# Patient Record
Sex: Female | Born: 1985 | Hispanic: Yes | Marital: Single | Smoking: Never smoker
Health system: Southern US, Community
[De-identification: ages and names within clinical notes are randomized; demographics above are authoritative.]

## PROBLEM LIST (undated history)

## (undated) DIAGNOSIS — Z789 Other specified health status: Secondary | ICD-10-CM

## (undated) DIAGNOSIS — I10 Essential (primary) hypertension: Secondary | ICD-10-CM

## (undated) HISTORY — PX: NO PAST SURGERIES: SHX2092

## (undated) HISTORY — DX: Essential (primary) hypertension: I10

## (undated) HISTORY — DX: Other specified health status: Z78.9

---

## 2005-08-04 ENCOUNTER — Emergency Department (HOSPITAL_COMMUNITY): Admission: EM | Admit: 2005-08-04 | Discharge: 2005-08-04 | Payer: Self-pay | Admitting: Emergency Medicine

## 2005-08-07 ENCOUNTER — Ambulatory Visit (HOSPITAL_COMMUNITY): Admission: RE | Admit: 2005-08-07 | Discharge: 2005-08-07 | Payer: Self-pay | Admitting: Emergency Medicine

## 2007-06-08 ENCOUNTER — Emergency Department (HOSPITAL_COMMUNITY): Admission: EM | Admit: 2007-06-08 | Discharge: 2007-06-08 | Payer: Self-pay | Admitting: Emergency Medicine

## 2007-06-18 ENCOUNTER — Emergency Department (HOSPITAL_COMMUNITY): Admission: EM | Admit: 2007-06-18 | Discharge: 2007-06-18 | Payer: Self-pay | Admitting: Emergency Medicine

## 2007-10-10 ENCOUNTER — Emergency Department (HOSPITAL_COMMUNITY): Admission: EM | Admit: 2007-10-10 | Discharge: 2007-10-10 | Payer: Self-pay | Admitting: Emergency Medicine

## 2009-04-05 ENCOUNTER — Emergency Department (HOSPITAL_COMMUNITY): Admission: EM | Admit: 2009-04-05 | Discharge: 2009-04-05 | Payer: Self-pay | Admitting: Family Medicine

## 2009-09-13 ENCOUNTER — Ambulatory Visit: Payer: Self-pay | Admitting: Internal Medicine

## 2009-09-13 ENCOUNTER — Encounter (INDEPENDENT_AMBULATORY_CARE_PROVIDER_SITE_OTHER): Payer: Self-pay | Admitting: Family Medicine

## 2009-09-13 LAB — CONVERTED CEMR LAB
AST: 15 units/L (ref 0–37)
Albumin: 4.2 g/dL (ref 3.5–5.2)
Alkaline Phosphatase: 95 units/L (ref 39–117)
BUN: 8 mg/dL (ref 6–23)
CO2: 22 meq/L (ref 19–32)
Chloride: 102 meq/L (ref 96–112)
Eosinophils Absolute: 0.2 10*3/uL (ref 0.0–0.7)
Eosinophils Relative: 4 % (ref 0–5)
Glucose, Bld: 105 mg/dL — ABNORMAL HIGH (ref 70–99)
Hemoglobin: 13.9 g/dL (ref 12.0–15.0)
Lymphocytes Relative: 40 % (ref 12–46)
Lymphs Abs: 2.5 10*3/uL (ref 0.7–4.0)
MCHC: 32.8 g/dL (ref 30.0–36.0)
MCV: 93.6 fL (ref 78.0–100.0)
Monocytes Absolute: 0.3 10*3/uL (ref 0.1–1.0)
Monocytes Relative: 5 % (ref 3–12)
Potassium: 4.1 meq/L (ref 3.5–5.3)
RDW: 12.3 % (ref 11.5–15.5)
Sed Rate: 30 mm/hr — ABNORMAL HIGH (ref 0–22)
Total Bilirubin: 0.3 mg/dL (ref 0.3–1.2)

## 2009-09-14 ENCOUNTER — Ambulatory Visit (HOSPITAL_COMMUNITY): Admission: RE | Admit: 2009-09-14 | Discharge: 2009-09-14 | Payer: Self-pay | Admitting: Family Medicine

## 2009-09-19 ENCOUNTER — Ambulatory Visit: Payer: Self-pay | Admitting: Internal Medicine

## 2009-09-19 ENCOUNTER — Encounter (INDEPENDENT_AMBULATORY_CARE_PROVIDER_SITE_OTHER): Payer: Self-pay | Admitting: Family Medicine

## 2009-11-23 ENCOUNTER — Ambulatory Visit: Payer: Self-pay | Admitting: Internal Medicine

## 2011-02-21 LAB — URINALYSIS, ROUTINE W REFLEX MICROSCOPIC
Bilirubin Urine: NEGATIVE
Glucose, UA: NEGATIVE
Glucose, UA: NEGATIVE
Hgb urine dipstick: NEGATIVE
Ketones, ur: 15 — AB
Ketones, ur: >80 — AB
Protein, ur: NEGATIVE
Specific Gravity, Urine: 1.028
Specific Gravity, Urine: 1.029
Urobilinogen, UA: 1
Urobilinogen, UA: 4 — ABNORMAL HIGH
pH: 6
pH: 6.5

## 2011-02-21 LAB — COMPREHENSIVE METABOLIC PANEL
ALT: 15
Alkaline Phosphatase: 73
BUN: 8
CO2: 23
Chloride: 103
Creatinine, Ser: 0.65
GFR calc Af Amer: >60
Potassium: 3.4 — ABNORMAL LOW
Total Bilirubin: 0.6
Total Protein: 7.5

## 2011-02-21 LAB — DIFFERENTIAL
Basophils Absolute: 0
Basophils Relative: 0
Eosinophils Relative: 0
Lymphs Abs: 1.4
Monocytes Absolute: 0.3
Monocytes Relative: 4
Neutro Abs: 6.9
Neutrophils Relative %: 79 — ABNORMAL HIGH

## 2011-02-21 LAB — PREGNANCY, URINE: Preg Test, Ur: NEGATIVE

## 2011-02-21 LAB — URINE CULTURE: Colony Count: >=100000

## 2011-02-21 LAB — CBC
HCT: 39.5
MCHC: 34.3
RDW: 12.5

## 2011-07-30 ENCOUNTER — Encounter (HOSPITAL_COMMUNITY): Payer: Self-pay | Admitting: Emergency Medicine

## 2011-07-30 ENCOUNTER — Emergency Department (INDEPENDENT_AMBULATORY_CARE_PROVIDER_SITE_OTHER): Payer: Self-pay

## 2011-07-30 ENCOUNTER — Emergency Department (HOSPITAL_COMMUNITY)
Admission: EM | Admit: 2011-07-30 | Discharge: 2011-07-30 | Disposition: A | Discharge status: Home / Self Care | Payer: Self-pay | Source: Home / Self Care | Attending: Family Medicine | Admitting: Family Medicine

## 2011-07-30 ENCOUNTER — Emergency Department (HOSPITAL_COMMUNITY): Payer: Self-pay

## 2011-07-30 DIAGNOSIS — M25559 Pain in unspecified hip: Secondary | ICD-10-CM

## 2011-07-30 LAB — POCT URINALYSIS DIP (DEVICE)
Bilirubin Urine: NEGATIVE
Leukocytes, UA: NEGATIVE
Nitrite: NEGATIVE
Protein, ur: NEGATIVE mg/dL
Urobilinogen, UA: 0.2 mg/dL (ref 0.0–1.0)
pH: 6 (ref 5.0–8.0)

## 2011-07-30 LAB — POCT PREGNANCY, URINE: Preg Test, Ur: NEGATIVE

## 2011-07-30 MED ORDER — NAPROXEN 375 MG PO TABS: 375.0000 mg | ORAL_TABLET | Freq: Two times a day (BID) | ORAL | Status: AC

## 2011-07-30 MED ORDER — HYDROCODONE-ACETAMINOPHEN 5-325 MG PO TABS: ORAL_TABLET | ORAL | Status: AC

## 2011-07-30 NOTE — Discharge Instructions (Signed)
Your x-ray was negative for any acute findings. Based on my examination, I feel that the pain is likely related to the hip and not the stomach. So, I recommend a followup appointment and evaluation with an orthopedic doctor, which is a special type of Dr. that deals only with muscles and bones. Please call the office listed and make the next available appointment. You might also need a referral from your regular doctor. Take medication as directed. Take the naproxen regularly, every day for baseline pain control. Use the hydrocodone, a pain pill only, as needed. Return to care, sooner should your symptoms not improve, or worsen in any way.

## 2011-07-30 NOTE — ED Provider Notes (Signed)
History     CSN: [id number]  Arrival date & time 07/30/11  1109   First MD Initiated Contact with Patient 07/30/11 1226      Chief Complaint  Patient presents with  . Abdominal Pain    (Consider location/radiation/quality/duration/timing/severity/associated sxs/prior treatment) HPI Comments: Caroljean presents for evaluation of right lower quadrant pain and right hip pain. She reports pain over the last one month. She denies any injury. She reports that the pain comes and goes. She denies any nausea, vomiting, or change in bowel habits. Her last period was on February 5. She denies any urinary symptoms. No dysuria. No hematuria. She denies any vaginal discharge or pain. She reports pain in her hip, with any movement and with walking. She denies any numbness, tingling, or weakness. Patient is a 26 y.o. female presenting with abdominal pain.  Abdominal Pain The primary symptoms of the illness include abdominal pain. The primary symptoms of the illness do not include nausea, vomiting, diarrhea, dysuria, vaginal discharge or vaginal bleeding. The onset of the illness was sudden. The problem has not changed since onset. The abdominal pain began more than 2 days ago. The pain came on suddenly. The abdominal pain has been unchanged since its onset. The abdominal pain is located in the RLQ. The abdominal pain radiates to the right leg.    History reviewed. No pertinent past medical history.  History reviewed. No pertinent past surgical history.  History reviewed. No pertinent family history.  History  Substance Use Topics  . Smoking status: Never Smoker   . Smokeless tobacco: Not on file  . Alcohol Use: No    OB History    Grav Para Term Preterm Abortions TAB SAB Ect Mult Living                  Review of Systems  Constitutional: Negative.   HENT: Negative.   Eyes: Negative.   Respiratory: Negative.   Cardiovascular: Negative.   Gastrointestinal: Positive for abdominal pain.  Negative for nausea, vomiting and diarrhea.  Genitourinary: Negative.  Negative for dysuria, vaginal bleeding and vaginal discharge.  Musculoskeletal: Positive for arthralgias.  Skin: Negative.   Neurological: Negative.     Allergies  Review of patient's allergies indicates no known allergies.  Home Medications   Current Outpatient Rx  Name Route Sig Dispense Refill  . OVER THE COUNTER MEDICATION  Birth control pill    . HYDROCODONE-ACETAMINOPHEN 5-325 MG PO TABS  Take one to two tablets every 4 to 6 hours as needed for pain 20 tablet 0  . NAPROXEN 375 MG PO TABS Oral Take 1 tablet (375 mg total) by mouth 2 (two) times daily. 20 tablet 0    BP 127/76  Pulse 80  Temp(Src) 98.4 F (36.9 C) (Oral)  Resp 16  SpO2 100%  LMP 07/09/2011  Physical Exam  Nursing note and vitals reviewed. Constitutional: She is oriented to person, place, and time. She appears well-developed and well-nourished.  HENT:  Head: Normocephalic and atraumatic.  Eyes: EOM are normal.  Neck: Normal range of motion.  Cardiovascular: Normal rate and regular rhythm.   Pulmonary/Chest: Effort normal and breath sounds normal. She has no decreased breath sounds. She has no wheezes. She has no rhonchi.  Musculoskeletal:       Right hip: She exhibits tenderness and bony tenderness. She exhibits normal range of motion and normal strength.       RIGHT hip: full flexion, extension, abduction, adduction against resistance; 5/5 strength; pain elicited with  internal or external rotation of hip; pain elicited with resisted and passive flexion and extension; negative FABERE  Neurological: She is alert and oriented to person, place, and time.  Skin: Skin is warm and dry.  Psychiatric: Her behavior is normal.    ED Course  Procedures (including critical care time)  Labs Reviewed  POCT URINALYSIS DIP (DEVICE) - Abnormal; Notable for the following:    Ketones, ur TRACE (*)    Hgb urine dipstick TRACE (*)    All other  components within normal limits  POCT PREGNANCY, URINE   Dg Hip Complete Right  07/30/2011  *RADIOLOGY REPORT*  Clinical Data: Right hip pain.  RIGHT HIP - COMPLETE 2+ VIEW  Comparison:  None.  Findings:  There is no evidence of hip fracture or dislocation. There is no evidence of arthropathy or other focal bone abnormality.  IMPRESSION: Negative.  Original Report Authenticated By: Danae Orleans, M.D.     1. Hip pain       MDM  Xray reviewed by radiologist and myself; negative for any acute findings; referred to [facility]; given rx for naproxen, and hydrocodone PRN        Richardo Priest, MD 07/30/11 1707

## 2011-07-30 NOTE — ED Notes (Signed)
Checked with pt ,gave pt blanket &family member soda

## 2011-07-30 NOTE — ED Notes (Signed)
Reports abdominal pain for one month, right, lower quadrant abdominal pain .  Last bm was this am, normal.  Having a bm makes pain go away sometimes.  Last period was 2/5, not normal, shorter and light than usual, taking birth control pill for five years.  denis urinary symptoms. Denies vaginal discharge

## 2011-08-05 ENCOUNTER — Other Ambulatory Visit: Payer: Self-pay | Admitting: Family Medicine

## 2011-08-05 DIAGNOSIS — N644 Mastodynia: Secondary | ICD-10-CM

## 2011-08-08 ENCOUNTER — Ambulatory Visit
Admission: RE | Admit: 2011-08-08 | Discharge: 2011-08-08 | Disposition: A | Discharge status: Home / Self Care | Payer: Self-pay | Source: Ambulatory Visit | Attending: Family Medicine | Admitting: Family Medicine

## 2011-08-08 DIAGNOSIS — N644 Mastodynia: Secondary | ICD-10-CM

## 2012-10-22 ENCOUNTER — Encounter: Payer: Self-pay | Admitting: Obstetrics & Gynecology

## 2012-11-23 ENCOUNTER — Encounter: Payer: Self-pay | Admitting: Obstetrics & Gynecology

## 2013-02-22 ENCOUNTER — Ambulatory Visit (INDEPENDENT_AMBULATORY_CARE_PROVIDER_SITE_OTHER): Payer: Self-pay | Admitting: Obstetrics & Gynecology

## 2013-02-22 ENCOUNTER — Encounter: Payer: Self-pay | Admitting: Obstetrics & Gynecology

## 2013-02-22 VITALS — BP 112/78 | HR 76 | Temp 97.7°F | Ht <= 58 in | Wt 113.0 lb

## 2013-02-22 DIAGNOSIS — R102 Pelvic and perineal pain unspecified side: Secondary | ICD-10-CM | POA: Insufficient documentation

## 2013-02-22 DIAGNOSIS — N921 Excessive and frequent menstruation with irregular cycle: Secondary | ICD-10-CM | POA: Insufficient documentation

## 2013-02-22 DIAGNOSIS — N949 Unspecified condition associated with female genital organs and menstrual cycle: Secondary | ICD-10-CM

## 2013-02-22 MED ORDER — DICLOFENAC SODIUM 75 MG PO TBEC: 75.0000 mg | DELAYED_RELEASE_TABLET | Freq: Two times a day (BID) | ORAL | Status: DC

## 2013-02-22 NOTE — Progress Notes (Signed)
Patient ID: Diamond Baldwin, female   DOB: 03/16/1987, 27 y.o.   MRN: [id number]  Chief Complaint  Patient presents with  . Referral    from [facility]  . Pelvic Pain    RLQ pain for last 4 months     HPI Diamond Baldwin is a 27 y.o. female.  G1P1001 Patient's last menstrual period was 02/16/2013. Referred by [facility] in 5/14 with 7 months of RLQ pain which is now constant, relieved by tylenol, some dyspareunia. Has BTB on OCP  HPI  History reviewed. No pertinent past medical history.  History reviewed. No pertinent past surgical history.  History reviewed. No pertinent family history.  Social History History  Substance Use Topics  . Smoking status: Never Smoker   . Smokeless tobacco: Never Used  . Alcohol Use: No    No Known Allergies  Current Outpatient Prescriptions  Medication Sig Dispense Refill  . calcium gluconate 500 MG tablet Take 500 mg by mouth daily.      . norethindrone-ethinyl estradiol-iron (ESTROSTEP FE,TILIA FE,TRI-LEGEST FE) 1-20/1-30/1-35 MG-MCG tablet Take 1 tablet by mouth daily.       No current facility-administered medications for this visit.    Review of Systems Review of Systems  Constitutional: Negative for fever.  Gastrointestinal: Positive for constipation. Negative for nausea, diarrhea, abdominal distention and rectal pain.  Genitourinary: Positive for vaginal bleeding, menstrual problem, pelvic pain and dyspareunia. Negative for urgency and vaginal discharge.  Musculoskeletal: Positive for back pain (radiates to right back).    Blood pressure 112/78, pulse 76, temperature 97.7 F (36.5 C), temperature source Oral, height 4\' 9"  (1.448 m), weight 113 lb (51.256 kg), last menstrual period 02/16/2013.  Physical Exam Physical Exam  Constitutional: She appears well-developed and well-nourished. No distress.  Pulmonary/Chest: Effort normal. No respiratory distress.  Abdominal: Soft. She exhibits no mass. There is tenderness (mild  periumbilical). There is no rebound and no guarding.  Genitourinary: Vagina normal and uterus normal. No vaginal discharge found.  Right side and CM tenderness  Neurological: She is alert.  Skin: Skin is warm and dry.  Psychiatric: She has a normal mood and affect. Her behavior is normal.    Data Reviewed referral  Assessment    Pelvic pain and BTB on OCP     Plan    Pelvic US Voltaren 75 mg BID 10 days then prn RTC next mo          ARNOLD,JAMES 02/22/2013, 4:08 PM

## 2013-02-22 NOTE — Patient Instructions (Signed)
Uso de los anticonceptivos orales  (Oral Contraception Use) Los anticonceptivos orales son medicamentos que se utilizan para evitar el embarazo. Su funcin es evitar que los ovarios liberen vulos. Las hormonas de los anticonceptivos orales hacen que el moco cervical se haga ms espeso, lo que evita que el esperma ingrese al tero. Tambin hacen que la membrana que tapiza el tero se vuelva ms fina, lo que no permite que el huevo fertilizado se adhiera a la pared del tero. Los anticonceptivos orales son muy efectivos cuando se toman exactamente como se prescriben. Sin embargo, los anticonceptivos orales no previenen contra las enfermedades de transmisin sexual (ETS). La prctica del sexo seguro, como el uso de preservativos, junto con los anticonceptivos orales, ayudan a prevenir ese tipo de enfermedades. Antes de tomar la pldora, usted debe hacerse un examen fsico y un [place]. El mdico podr indicarle anlisis de sangre, si es necesario. El mdico se asegurar de que usted es una buena candidata para usar anticonceptivos orales. Converse con su mdico acerca de los posibles efectos secundarios de los anticonceptivos orales. Cuando se inicia el uso de anticonceptivos orales, se pueden tomar durante 2 a 3 meses para que el cuerpo se adapte a los cambios en los niveles hormonales en el cuerpo.  CMO TOMAR LOS ANTICONCEPTIVOS ORALES  El mdico le indicar como comenzar a tomar el primer ciclo de anticonceptivos orales. De lo contrario usted puede:   Comenzar el 1er. da del ciclo menstrual. No necesitar proteccin anticonceptiva adicional al comenzar en este momento.  Comenzar el primer domingo luego de su perodo menstrual, o el da en que adquiere el medicamento. En estos casos deber tener proteccin anticonceptiva adicional durante los primeros 7 das del ciclo. Luego de comenzar a tomar los anticonceptivos orales:   Si olvid de tomar 1 pldora, tmela tan pronto como lo recuerde. Tome la  siguiente pldora a la hora habitual.  Si olvida tomar 2  ms pldoras, utilice un mtodo anticonceptivo adicional hasta que comience su prximo perodo menstrual.  Si utiliza el envase de 28 pldoras y olvida tomar 1 de las ltimas 7 (pldoras sin hormonas), sto no tiene importancia. Simplemente deseche el resto de las pldoras que no contienen hormonas y comience un nuevo envase. No importa cuando comience a tomar los anticonceptivos, siempre empiece un nuevo envase el mismo da de la semana. Tenga un envase extra de pldoras anticonceptivas y use un mtodo anticonceptivo adicional para el caso en que se olvide de tomar algunas pldoras o pierda la caja.  INSTRUCCIONES PARA EL CUIDADO DOMICILIARIO  No fume.  Siempre use un condn para protegerse de las enfermedades de transmisin sexual. Los anticonceptivos orales no protegen contra las enfermedades de transmisin sexual.  Marque en un calendario las fechas en las que tiene sus perodos menstruales.  Lea la informacin y consejos que vienen con las pldoras. Pngase en contacto con el mdico siempre que tenga preguntas. SOLICITE ATENCIN MDICA SI:  Presenta nuseas o vmitos.  Tiene flujo o sangrado vaginal anormal.  Aparece una erupcin cutnea.  No tiene el perodo menstrual.  Pierde el cabello.  Necesita tratamiento por cambios en su estado de nimo o por depresin.  Se siente mareada al tomar la pldora.  Comienza a aparecer acn con el uso de los anticonceptivos orales.  Queda embarazada. SOLICITE ATENCIN MDICA DE INMEDIATO SI:  Siente dolor en el pecho.  Le falta el aire.  Le duele mucho la cabeza y no puede controlar el dolor.  Siente adormecimiento o tiene   dificultad para hablar.  Tiene problemas de visin.  Presenta dolor, inflamacin o hinchazn en las piernas. Document Released: 05/09/2011 Document Revised: 08/12/2011 [employer] Patient Information 2014 [employer].  

## 2013-03-05 ENCOUNTER — Encounter (HOSPITAL_COMMUNITY): Payer: Self-pay | Admitting: Emergency Medicine

## 2013-03-09 ENCOUNTER — Ambulatory Visit (HOSPITAL_COMMUNITY): Payer: Self-pay

## 2013-03-25 ENCOUNTER — Ambulatory Visit: Payer: Self-pay | Admitting: Obstetrics & Gynecology

## 2013-03-30 ENCOUNTER — Ambulatory Visit (HOSPITAL_COMMUNITY)
Admission: RE | Admit: 2013-03-30 | Discharge: 2013-03-30 | Disposition: A | Discharge status: Home / Self Care | Payer: Self-pay | Source: Ambulatory Visit | Attending: Obstetrics & Gynecology | Admitting: Obstetrics & Gynecology

## 2013-03-30 DIAGNOSIS — N921 Excessive and frequent menstruation with irregular cycle: Secondary | ICD-10-CM

## 2013-03-30 DIAGNOSIS — N949 Unspecified condition associated with female genital organs and menstrual cycle: Secondary | ICD-10-CM | POA: Insufficient documentation

## 2013-03-30 DIAGNOSIS — R102 Pelvic and perineal pain: Secondary | ICD-10-CM

## 2013-04-23 ENCOUNTER — Ambulatory Visit (INDEPENDENT_AMBULATORY_CARE_PROVIDER_SITE_OTHER): Payer: Self-pay | Admitting: Obstetrics & Gynecology

## 2013-04-23 ENCOUNTER — Encounter: Payer: Self-pay | Admitting: Obstetrics & Gynecology

## 2013-04-23 VITALS — BP 120/79 | HR 81 | Temp 97.0°F | Ht <= 58 in | Wt 115.6 lb

## 2013-04-23 DIAGNOSIS — R102 Pelvic and perineal pain: Secondary | ICD-10-CM

## 2013-04-23 DIAGNOSIS — N949 Unspecified condition associated with female genital organs and menstrual cycle: Secondary | ICD-10-CM

## 2013-04-23 MED ORDER — DICLOFENAC SODIUM 75 MG PO TBEC: 75.0000 mg | DELAYED_RELEASE_TABLET | Freq: Two times a day (BID) | ORAL | Status: DC

## 2013-04-23 NOTE — Patient Instructions (Signed)
Back Pain, Adult Low back pain is very common. About 1 in 5 people have back pain.The cause of low back pain is rarely dangerous. The pain often gets better over time.About half of people with a sudden onset of back pain feel better in just 2 weeks. About 8 in 10 people feel better by 6 weeks.  CAUSES Some common causes of back pain include:  Strain of the muscles or ligaments supporting the spine.  Wear and tear (degeneration) of the spinal discs.  Arthritis.  Direct injury to the back. DIAGNOSIS Most of the time, the direct cause of low back pain is not known.However, back pain can be treated effectively even when the exact cause of the pain is unknown.Answering your caregiver's questions about your overall health and symptoms is one of the most accurate ways to make sure the cause of your pain is not dangerous. If your caregiver needs more information, he or she may order lab work or imaging tests (X-rays or MRIs).However, even if imaging tests show changes in your back, this usually does not require surgery. HOME CARE INSTRUCTIONS For many people, back pain returns.Since low back pain is rarely dangerous, it is often a condition that people can learn to manageon their own.   Remain active. It is stressful on the back to sit or stand in one place. Do not sit, drive, or stand in one place for more than 30 minutes at a time. Take short walks on level surfaces as soon as pain allows.Try to increase the length of time you walk each day.  Do not stay in bed.Resting more than 1 or 2 days can delay your recovery.  Do not avoid exercise or work.Your body is made to move.It is not dangerous to be active, even though your back may hurt.Your back will likely heal faster if you return to being active before your pain is gone.  Pay attention to your body when you bend and lift. Many people have less discomfortwhen lifting if they bend their knees, keep the load close to their bodies,and  avoid twisting. Often, the most comfortable positions are those that put less stress on your recovering back.  Find a comfortable position to sleep. Use a firm mattress and lie on your side with your knees slightly bent. If you lie on your back, put a pillow under your knees.  Only take over-the-counter or prescription medicines as directed by your caregiver. Over-the-counter medicines to reduce pain and inflammation are often the most helpful.Your caregiver may prescribe muscle relaxant drugs.These medicines help dull your pain so you can more quickly return to your normal activities and healthy exercise.  Put ice on the injured area.  Put ice in a plastic bag.  Place a towel between your skin and the bag.  Leave the ice on for 15-20 minutes, 03-04 times a day for the first 2 to 3 days. After that, ice and heat may be alternated to reduce pain and spasms.  Ask your caregiver about trying back exercises and gentle massage. This may be of some benefit.  Avoid feeling anxious or stressed.Stress increases muscle tension and can worsen back pain.It is important to recognize when you are anxious or stressed and learn ways to manage it.Exercise is a great option. SEEK MEDICAL CARE IF:  You have pain that is not relieved with rest or medicine.  You have pain that does not improve in 1 week.  You have new symptoms.  You are generally not feeling well. SEEK   IMMEDIATE MEDICAL CARE IF:   You have pain that radiates from your back into your legs.  You develop new bowel or bladder control problems.  You have unusual weakness or numbness in your arms or legs.  You develop nausea or vomiting.  You develop abdominal pain.  You feel faint. Document Released: 05/20/2005 Document Revised: 11/19/2011 Document Reviewed: 10/08/2010 [facility] Patient Information 2014 [place], [place]. Dolor plvico en la mujer  (Pelvic Pain, Female)  Las causa del dolor plvico en la mujer pueden ser muchas y  pueden tener su origen en diferentes lugares. El dolor plvico es el que aparece en la mitad inferior del abdomen y [place] caderas. Puede aparecer durante en un perodo corto de tiempo (agudo)o puede ser recurrente (crnico). Esta afeccin puede estar relacionada con trastornos que afectan a los rganos reproductivos femeninos (ginecolgica), pero tambin puede deberse a problemas en la vejiga, clculos renales, complicaciones intestinales, o problemas musculares o esquelticos. Es Garment/textile technologist ayuda de inmediato, sobre todo si ha sido intenso, [place], o ha aparecido de Wellsite geologist sbita como un dolor inusual. Tambin es importante obtener ayuda de inmediato, ya que algunos tipos de dolor plvico puede poner en peligro la vida.  CAUSAS  A continuacin veremos algunas de las causas del dolor plvico. Las causas pueden clasificarse de diferentes modos.   Ginecolgica.  Enfermedad inflamatoria plvica.  Infecciones de transmisin sexual.  Quiste de ovario o torsin de un ligamento ovrico ( torsin ovrica).  La membrana que recubre internamente al tero desarrollndose fuera del tero (endometriosis).  Fibromas, quistes o tumores.  Ovulacin.  Embarazo.  Embarazo fuera del tero (embarazo ectpico).  Aborto espontneo.  Trabajo de [place].  Desprendimiento de la placenta o ruptura del tero.  Infecciones.  Infeccin uterina (endometritis).  Infeccin de la vejiga.  Diverticulitis.  Aborto relacionado con una infeccin uterina (aborto sptico).  Vejiga.  Inflamacin de la vejiga (cistitis).  Clculos renales.  Gastrointenstinal.  Estreimiento.  Diverticulitis.  Neurolgico.  Traumatismos.  Sentir dolor plvico debido a causas mentales o emocionales (psicosomtico).  Tumores en el intestino o en la pelvis. EVALUACIN  El mdico har una historia clnica detallada segn sus sntomas. Incluir los cambios recientes en su salud, una cuidadosa historia  ginecolgica de sus periodos (menstruaciones) y Neomia Dear historia de su actividad sexual. Los antecedentes familiares y la historia clnica tambin son importantes. Su mdico podr indicar un examen plvico. El examen plvico ayudar a identificar la ubicacin y la gravedad del Engineer, mining. Tambin ayudar a [employer] rganos que pueden estar involucrados. Myrtha Mantis identificar la causa del dolor plvico y tratarlo adecuadamente, el mdico puede indicar estudios. Estas pruebas pueden ser:   Test de embarazo.  Ecografa plvica.  Radiografa del abdomen.  Un anlisis de [place] o la evaluacin de la secrecin vaginal.  Anlisis de [place]. INSTRUCCIONES PARA EL CUIDADO EN EL HOGAR   Solo tome medicamentos de venta libre o recetados para Chief Technology Officer, Dentist o fiebre, segn las indicaciones del mdico.   Haga reposo segn las indicaciones del mdico.   Consuma una dieta balanceada.   Beba gran cantidad de lquido para mantener la orina de tono claro o amarillo plido.   Evite las relaciones sexuales, Counsellor.   Aplique compresas calientes o fras en la zona baja del abdomen segn cual le calme el dolor.   Evite las situaciones estresantes.   Lleve un registro del dolor plvico. Anote cundo comenz, dnde se localiza el dolor y si hay cosas que parecen estar asociadas  con el dolor, como algn alimento o su ciclo menstrual.  Concurra a las visitas de control con el mdico, segn las indicaciones.  SOLICITE ATENCIN MDICA SI:   Los medicamentos no Editor, commissioning.  Tiene flujo vaginal anormal. SOLICITE ATENCIN MDICA DE INMEDIATO SI:   Tiene un sangrado abundante por la vagina.   El dolor plvico aumenta.   Se siente mareada o sufre un desmayo.   Siente escalofros.   Siente dolor intenso al Geographical information systems officer u observa sangre en la orina.   Tiene diarrea o vmitos que no puede controlar.   Tiene fiebre o sntomas que persisten durante ms de [address]  [address].  Tiene fiebre y  los sntomas [address].   Ha sido abusada fsica o sexualmente.  ASEGRESE DE QUE:   Comprende estas instrucciones.  Controlar su enfermedad.  Solicitar ayuda de inmediato si no mejora o si empeora. Document Released: 08/16/2008 Document Revised: 11/19/2011 [facility] Patient Information 2014 [place], [place].

## 2013-05-04 NOTE — Progress Notes (Signed)
   Subjective:    Patient ID: Diamond Baldwin, female    DOB: 1985/08/24, 27 y.o.   MRN: [id number]  [id number] Patient's last menstrual period was 03/23/2013. Patient returns for f/u after [place] was done for pelvic pain and BTB on OCP  No past medical history on file. No past surgical history on file. Current Outpatient Prescriptions on File Prior to Visit  Medication Sig Dispense Refill  . calcium gluconate 500 MG tablet Take 500 mg by mouth daily.       No current facility-administered medications on file prior to visit.   No Known Allergies     Review of Systems  Genitourinary: Positive for pelvic pain (RLQ pain, back pain and radiates down leg). Negative for menstrual problem.       Objective:   Physical Exam  Constitutional: She appears well-developed. No distress.  Abdominal: There is no tenderness.  Skin: Skin is warm and dry.  Psychiatric: She has a normal mood and affect. Her behavior is normal.          Assessment & Plan:  Suspect MS pain, continue Voltaren and RTC 6 weeks  Adam Phenix, MD

## 2013-05-13 ENCOUNTER — Ambulatory Visit (INDEPENDENT_AMBULATORY_CARE_PROVIDER_SITE_OTHER): Payer: Self-pay | Admitting: *Deleted

## 2013-05-13 DIAGNOSIS — Z3201 Encounter for pregnancy test, result positive: Secondary | ICD-10-CM

## 2013-05-14 LAB — OBSTETRIC PANEL
Basophils Absolute: 0 10*3/uL (ref 0.0–0.1)
Basophils Relative: 0 % (ref 0–1)
Eosinophils Absolute: 0.3 10*3/uL (ref 0.0–0.7)
Eosinophils Relative: 3 % (ref 0–5)
MCH: 31.6 pg (ref 26.0–34.0)
MCV: 91.5 fL (ref 78.0–100.0)
Platelets: 302 10*3/uL (ref 150–400)
RDW: 12.4 % (ref 11.5–15.5)
WBC: 8.4 10*3/uL (ref 4.0–10.5)

## 2013-05-17 LAB — HEMOGLOBINOPATHY EVALUATION
Hemoglobin Other: 0 %
Hgb A2 Quant: 2.9 % (ref 2.2–3.2)
Hgb A: 97.1 % (ref 96.8–97.8)
Hgb S Quant: 0 %

## 2013-06-02 ENCOUNTER — Encounter: Payer: Self-pay | Admitting: Obstetrics and Gynecology

## 2013-06-02 ENCOUNTER — Ambulatory Visit (INDEPENDENT_AMBULATORY_CARE_PROVIDER_SITE_OTHER): Payer: Self-pay | Admitting: Obstetrics and Gynecology

## 2013-06-02 VITALS — BP 127/83 | Wt 111.7 lb

## 2013-06-02 DIAGNOSIS — Z3480 Encounter for supervision of other normal pregnancy, unspecified trimester: Secondary | ICD-10-CM | POA: Insufficient documentation

## 2013-06-02 DIAGNOSIS — Z3481 Encounter for supervision of other normal pregnancy, first trimester: Secondary | ICD-10-CM

## 2013-06-02 LAB — POCT URINALYSIS DIP (DEVICE)
Hgb urine dipstick: NEGATIVE
Nitrite: NEGATIVE
Urobilinogen, UA: 0.2 mg/dL (ref 0.0–1.0)
pH: 6 (ref 5.0–8.0)

## 2013-06-02 MED ORDER — PRENATAL PLUS 27-1 MG PO TABS: 1.0000 | ORAL_TABLET | Freq: Every day | ORAL | Status: DC

## 2013-06-02 NOTE — Progress Notes (Signed)
   Subjective:    Diamond Baldwin is a G2P1001 [redacted]w[redacted]d by sure LMP (last on OCPs 5 months ago) being seen today for her first obstetrical visit.  Her obstetrical history is significant for NSVD x1. Patient does intend to breast feed. Pregnancy history fully reviewed.  Patient reports backache, heartburn and nausea.  Filed Vitals:   06/02/13 1011  BP: 127/83  Weight: 111 lb 11.2 oz (50.667 kg)    HISTORY: OB History  Gravida Para Term Preterm AB SAB TAB Ectopic Multiple Living  2 1 1  0 0 0 0 0 0 1    # Outcome Date GA Lbr Len/2nd Weight Sex Delivery Anes PTL Lv  2 CUR           1 TRM 01/23/05 [redacted]w[redacted]d  6 lb 13 oz (3.09 kg) F SVD   Y     Past Medical History  Diagnosis Date  . Medical history non-contributory    Past Surgical History  Procedure Laterality Date  . No past surgeries     History reviewed. No pertinent family history.   Exam    Uterus:   10 wk size  Pelvic Exam:    Perineum: No Hemorrhoids, Normal Perineum   Vulva: normal   Vagina:  normal mucosa, normal discharge       Cervix: multiparous appearance and no bleeding following Pap   Adnexa: not evaluated   Bony Pelvis: average  System: Breast:  normal appearance, no masses or tenderness   Skin: normal coloration and turgor, no rashes    Neurologic: oriented, normal, grossly non-focal   Extremities: normal strength, tone, and muscle mass   HEENT PERRLA   Mouth/Teeth mucous membranes moist, pharynx normal without lesions and dental hygiene good   Neck supple and no masses   Cardiovascular: regular rate and rhythm, no murmurs or gallops   Respiratory:  appears well, vitals normal, no respiratory distress, acyanotic, normal RR, ear and throat exam is normal, neck free of mass or lymphadenopathy, chest clear, no wheezing, crepitations, rhonchi, normal symmetric air entry   Abdomen: soft, non-tender; bowel sounds normal; no masses,  no organomegaly   Urinary: urethral meatus normal       Assessment:    Pregnancy: G2P1001 Patient Active Problem List   Diagnosis Date Noted  . Supervision of normal subsequent pregnancy 06/02/2013  . Pelvic pain in female 02/22/2013        Plan:     Initial labs drawn. Prenatal vitamins. Rx sent. recommended Tums or Riopan dfor heartburn. Tylenol or heat for sacral pain Problem list reviewed and updated. Genetic Screening discussed Quad Screen: undecided.  Ultrasound discussed; fetal survey: requested.  Follow up in 4 weeks. 50% of 30 min visit spent on counseling and coordination of care.    Durell Lofaso 06/02/2013

## 2013-06-02 NOTE — Progress Notes (Signed)
Pulse: 74

## 2013-06-02 NOTE — Patient Instructions (Signed)
Embarazo  Primer trimestre  (Pregnancy - First Trimester)  Durante el acto sexual, millones de espermatozoides entran en la vagina. Slo 1 espermatozoide penetra y fertiliza al vulo mientras se encuentra en la trompa de [place]. Una semana ms tarde, el vulo fertilizado se implanta en la pared del tero. Un embrin comienza a desarrollarse para ser un beb. A las 6 a 8 semanas se forman los ojos y la cara y los latidos del corazn se pueden ver en la ecografa. Al final de las 12 semanas (primer trimestre) todos los rganos del beb estn formados. Ahora que est embarazada, querr hacer todo lo que est a su alcance para tener un beb sano. Dos de las cosas ms importantes son: tener una buena atencin prenatal y seguir las indicaciones del profesional que la asiste. La atencin prenatal incluye toda la asistencia mdica que usted recibe antes del nacimiento del beb. Se lleva a cabo para prevenir y tratar problemas durante el embarazo y el parto. EXAMENES PRENATALES   Durante las visitas prenatales se controlan el peso, la presin arterial y se solicitan anlisis de orina. Esto se hace para asegurarse de que usted est sana y el embarazo progrese normalmente.  Una mujer embarazada debe aumentar de 25 a 35 libras durante el embarazo. Sin embargo, si usted tiene sobrepeso o bajo peso, su mdico le aconsejar qu hacer.  El podr hacerle preguntas y responder todas las que usted le haga.  Durante los exmenes prenatales se solicitan anlisis de sangre, cultivos del tero, un [place] y otros anlisis necesarios. Estas pruebas se realizan para controlar su salud y la del beb. Se recomienda que se haga la prueba para el diagnstico del VIH, con su autorizacin. Este es el virus que causa el SIDA. Estas pruebas se realizan porque existen medicamentos que podran administrarle para prevenir que el beb nazca con esta infeccin, si usted estuviera infectada y no lo supiera. Los anlisis de sangre  tambin se realizan para determinar el tipo de sangre, si tuvo infecciones previas y controlar sus niveles en la sangre (hemoglobina).  Tener un recuento bajo de hemoglobina (anemia) es comn durante el embarazo. Para prevenirla, se administran hierro y vitaminas. En una etapa ms avanzada del embarazo, le indicarn exmenes de sangre para saber si tiene diabetes, junto con otros anlisis, en caso de que tuviera problemas.  Es necesario que se haga las pruebas para asegurarse de que usted y el beb estn bien. CAMBIOS DURANTE EL PRIMER TRIMESTRE  Su organismo atravesar numerosos cambios durante el embarazo. Estos pueden variar de una persona a otra. Converse con el mdico acerca los cambios que usted nota y que la preocupan. Ellos son:   El perodo menstrual se detiene.  El vulo y los espermatozoides llevan los genes que determinan cmo seremos. Sus genes y los de su pareja forman el beb. Los genes del varn determinan si ser un nio o una nia.  La circunferencia de la cintura va a ir aumentando y podr sentirse hinchada.  Puede tener malestar estomacal (nuseas) y vmitos. Si no puede controlar los vmitos, consulte a su mdico.  Sus mamas comenzarn a agrandarse y sensibilizarse.  Los pezones pueden sobresalir ms y ser ms oscuros.  Tendr necesidad de orinar ms. El dolor al orinar puede significar que usted tiene una infeccin de la vejiga.  Se cansar con facilidad.  Prdida del apetito.  Sentir un fuerte deseo de consumir ciertos alimentos.  Al principio, usted puede ganar o perder un par de kilos.    Podr tener cambios emocionales de un da a otro (entusiasmo por estar embarazada o preocupacin por el embarazo y el beb).  Tendr sueos ms vvidos y extraos. INSTRUCCIONES PARA EL CUIDADO EN EL HOGAR   Es muy importante evitar el cigarrillo, el alcohol y los frmacos no recetados durante el embarazo. Estas sustancias afectan la formacin y el desarrollo del beb.  Evite los productos qumicos durante el embarazo para asegurar la salud del beb.  Comience las consultas prenatales alrededor de la 12 semana de embarazo. Generalmente se programan cada mes al principio y se hacen ms frecuentes en los 2 ltimos meses antes del parto. Cumpla con las citas de control. Siga las indicaciones del mdico con respecto al uso de medicamentos, los anlisis y pruebas de laboratorio, los ejercicios y la dieta.  Durante el embarazo debe obtener nutrientes para usted y para su beb. Consuma alimentos balanceados. Elija alimentos como carne, pescado, leche y otros productos lcteos descremados, vegetales, frutas, panes integrales y cereales. El mdico le informar cul es el aumento de peso ideal.  Las nuseas matinales pueden aliviarse si come algunas galletitas saladas en la cama. Coma dos galletitas antes de levantarse por la maana. Tambin puede comer galletitas sin sal.  Hacer 4 o 5 comidas pequeas en lugar de 3 comidas grandes por da tambin puede aliviar las nuseas y los vmitos.  Beber lquidos entre las comidas en lugar de tomarlos durante las comidas tambin puede ayudar a calmar las nuseas y los vmitos.  Puede continuar teniendo relaciones sexuales durante todo el embarazo si no hay otros problemas. Los problemas pueden ser una prdida precoz (prematura) de lquido amnitico, sangrado vaginal, o dolor en el vientre (abdominal).  Realice actividad fsica todos los das, si no tiene restricciones. Consulte con su mdico o terapeuta fsico si no est segura de algunos de sus ejercicios. El mayor aumento de peso se producir en los ltimos 2 trimestres del embarazo. El ejercicio le ayudar a:  Controlar su peso.  Mantenerse en forma.  Prepararse para el parto.  La ayudar a perder el peso del embarazo despus de que nazca su beb.  Use un buen sostn o como los que se usan para hacer deportes para aliviar la sensibilidad de las mamas. Tambin puede serle  til si lo usa mientras duerme.  Consulte cuando puede comenzar con las clases de pre parto. Comience con las clases cuando estn disponibles.  No utilice la baera con agua caliente, baos turcos y saunas.   Colquese el cinturn de seguridad cuando conduzca. Este la proteger a usted y al beb en caso de accidente.  Evite comer carne cruda y el contacto con los utensilios y desperdicios de los gatos. Estos elementos contienen grmenes que pueden causar defectos de nacimiento en el beb.  El primer trimestre es un buen momento para visitar a su dentista y evaluar su salud dental. Es importante mantener los dientes limpios. Use un cepillo de dientes suave y cepllese con ms suavidad durante el embarazo.  Pida ayuda si tienen necesidades financieras, teraputicas o nutricionales. El profesional podr ayudarla con respecto a estas necesidades, o derivarla a otros especialistas.  No tome medicamentos o hierbas excepto aquellos que le indic el profesional.  Informe a su mdico si sufre violencia familiar mental o fsica.  Haga una lista de nmeros de telfono de emergencia de la familia, los amigos, el hospital y los departamentos de polica y bomberos.  Escriba sus preguntas. Llvelas cuando concurra a su visita prenatal.  No se   haga duchas vaginales.  No cruce las piernas.  Si usted tiene que estar parada por largos perodos de tiempo, gire los pies o de pequeos pasos en crculo.  Es posible que tenga ms secreciones vaginales que puedan requerir una toalla higinica. No use tampones o toallas higinicas perfumadas. EL CONSUMO DE MEDICAMENTOS Y FRMACOS DURANTE EL EMBARAZO   Tome las vitaminas para la etapa prenatal tal como se le indic. Las vitaminas deben contener un miligramo de cido flico. Guarde todas las vitaminas fuera del alcance de los nios. La ingestin de slo un par de vitaminas o tabletas que contengan hierro pueden ocasionar la muerte en un beb o en un nio  pequeo.  Evite el uso de todos los medicamentos, incluyendo hierbas, medicamentos de venta libre, sin receta o que no hayan sido sugeridos por su mdico. Slo tome medicamentos de venta libre o medicamentos recetados para el dolor, el malestar o fiebre como lo indique su mdico. No tome aspirina, ibuprofeno o naproxeno excepto que su mdico se lo indique.  Infrmele al profesional si consume medicamentos de hierbas.  El alcohol se relaciona con ciertos defectos congnitos. Incluye el sndrome de alcoholismo fetal. Debe evitar absolutamente el consumo de alcohol, en cualquier forma. El fumar causar baja tasa de natalidad y bebs prematuros.  Las drogas ilegales o de la calle son muy perjudiciales para el beb. Estn absolutamente prohibidas. Un beb que nace de una madre adicta, ser adicto al nacer. Ese beb tendr los mismos sntomas de abstinencia que un adulto.  Informe a su mdico acerca de los medicamentos que ha tomado y el motivo por el que los tom. SOLICITE ATENCIN MDICA SI:  Tiene preguntas o preocupaciones relacionadas con el embarazo. Es mejor que llame para formular las preguntas si no puede esperar hasta la prxima visita, que sentirse preocupada por ellas.  SOLICITE ATENCIN MDICA DE INMEDIATO SI:   La temperatura oral le sube a ms de 38,9 C (102 F) o lo que su mdico le indique.  Tiene una prdida de lquido por la vagina (canal de parto). Si sospecha una ruptura de las membranas, tmese la temperatura y llame al profesional para informarlo sobre esto.  Observa unas pequeas manchas o una hemorragia vaginal. Notifique al profesional acerca de la cantidad y de cuntos apsitos est utilizando.  Presenta un olor desagradable en la secrecin vaginal y observa un cambio en el color.  Contina con las nuseas y no obtiene alivio de los remedios indicados. Vomita sangre o algo similar a la borra del caf.  Pierde ms de 2 libras (1 Kg) en una semana.  Aumenta ms de 1 Kg  en una semana y nota el rostro, las manos, los pies o las piernas hinchados.  Aumenta ms de 2,5 Kg en una semana (aunque no tenga las manos, pies, piernas o el rostro hinchados).  Ha estado expuesta a la rubola y no ha sufrido la enfermedad.  Ha estado expuesta a la quinta enfermedad o a la varicela.  Siente dolor en el vientre (abdominal). Las molestias en el ligamento redondo son una causa benigna frecuente de dolor abdominal durante el embarazo. El profesional que la asiste deber evaluarla.  Presenta dolor de cabeza, fiebre, diarrea, dolor al orinar o le falta la respiracin.  Se cae, se ve involucrada en un accidente automovilstico o sufre algn tipo de traumatismo.  En su hogar hay violencia mental o fsica. Document Released: 02/27/2005 Document Revised: 02/12/2012 ExitCare Patient Information 2014 [employer].  

## 2013-06-03 NOTE — L&D Delivery Note (Signed)
Delivery Note  PRE-OPERATIVE DIAGNOSIS:  1) 3218w0d pregnancy.   POST-OPERATIVE DIAGNOSIS:  1) 5318w0d pregnancy s/p Vaginal, Spontaneous Delivery   Delivery Type: Vaginal, Spontaneous Delivery   Delivery Clinician: Maebry Obrien   Delivery Anesthesia: None   Labor Complications: None  Lacerations: 1st degree;Labial   ESTIMATED BLOOD LOSS: 350    Labor course: This is a 28 y.o. y.o. female G2P2002  who came in at 4818w0d pregnancy complaining of contractions.  Her prenatal course was complicated by nothing.  Initial cervical exam was 6/80/-2.  She was admitted to L and D.  Labor course included:  Expectant management and AROM at 9.5 cm   Procedure: Vaginal, Spontaneous Delivery    Date of birth: 12/28/2013   Time of birth: 3:02 AM    This [id number] woman under no anesthesia delivered a viable female  infant weighing Weight: 7 lb 5.2 oz (3.323 kg) (Filed from Delivery Summary)  with Apgars as listed below.  Delivery was via NSVD  Delivery completed and cord cut and clamped. Infant dried and stimulated. Infant to maternal abdomen. Cord pH not obtained. Active management of the third stage of labor performed. intact placenta delivered spontaneously at 7/28  3:08 AM . Vagina and cervix explored and 1st degree labial repaired in an normal fashion with 3.0 vicryl in 1 layers with good approximation of tissue and hemostasis.  Uterus well contracted at end of delivery.  Mother and infant tolerated delivery well.  FINDINGS:   1) female infant, Apgar scores of 9    at 1 minute 10    at 5 minutes   2) 3 Vessel Cord  3) Nuchal: no  SPECIMENS: Placenta discared; Cord gases not sent  COMPLICATIONS: none  DISPOSITION:  Infant to NBN

## 2013-06-05 LAB — CULTURE, OB URINE: Colony Count: >=100000

## 2013-06-07 LAB — PRESCRIPTION MONITORING PROFILE (19 PANEL)
Amphetamine/Meth: NEGATIVE ng/mL
Barbiturate Screen, Urine: NEGATIVE ng/mL
Benzodiazepine Screen, Urine: NEGATIVE ng/mL
Buprenorphine, Urine: NEGATIVE ng/mL
Cannabinoid Scrn, Ur: NEGATIVE ng/mL
Carisoprodol, Urine: NEGATIVE ng/mL
Cocaine Metabolites: NEGATIVE ng/mL
Creatinine, Urine: 155.11 mg/dL (ref >20.0)
Fentanyl, Ur: NEGATIVE ng/mL
MDMA URINE: NEGATIVE ng/mL
Meperidine, Ur: NEGATIVE ng/mL
Methadone Screen, Urine: NEGATIVE ng/mL
Methaqualone: NEGATIVE ng/mL
Nitrites, Initial: NEGATIVE ug/mL
Opiate Screen, Urine: NEGATIVE ng/mL
Oxycodone Screen, Ur: NEGATIVE ng/mL
Phencyclidine, Ur: NEGATIVE ng/mL
Propoxyphene: NEGATIVE ng/mL
Tapentadol, urine: NEGATIVE ng/mL
Tramadol Scrn, Ur: NEGATIVE ng/mL
Zolpidem, Urine: NEGATIVE ng/mL
pH, Initial: 6.6 pH (ref 4.5–8.9)

## 2013-06-08 ENCOUNTER — Telehealth: Payer: Self-pay

## 2013-06-08 NOTE — Telephone Encounter (Signed)
[facility] and received message this person can not be reached.

## 2013-06-08 NOTE — Telephone Encounter (Signed)
Message copied by Faythe CasaBELLAMY, Lyrick Lagrand M on Tue Jun 08, 2013  5:00 PM ------      Message from: POE, DEIRDRE C      Created: Mon Jun 07, 2013  5:53 PM       Please cal in amox 500 tid x10d for ASB ------

## 2013-06-09 NOTE — Telephone Encounter (Signed)
Called patient with [facility] for interpreter and informed her of results and antibiotic available for pickup. Patient verbalized understanding and asked if this infection was harmful to the baby. Patient reassured that these infections can be common for women to get and isn't anything to worry about but we just want to make sure it gets treated. Patient verbalized understanding and had no further questions

## 2013-06-15 ENCOUNTER — Other Ambulatory Visit: Payer: Self-pay | Admitting: *Deleted

## 2013-06-15 MED ORDER — AMOXICILLIN 500 MG PO CAPS: 500.0000 mg | ORAL_CAPSULE | Freq: Three times a day (TID) | ORAL | Status: DC

## 2013-06-15 NOTE — Progress Notes (Signed)
Pt called and stated she had previously been contacted regarding need for antibiotic medication. When she went to the pharmacy, they did not have the order. Per chart review, it appears that the order was not sent. I sent the order to her pharmacy today and pt was informed by Tobi BastosAnna that she may pick it up later today.  Pt voiced understanding.

## 2013-06-30 ENCOUNTER — Ambulatory Visit (INDEPENDENT_AMBULATORY_CARE_PROVIDER_SITE_OTHER): Payer: Self-pay | Admitting: Advanced Practice Midwife

## 2013-06-30 VITALS — BP 113/74 | Wt 109.8 lb

## 2013-06-30 DIAGNOSIS — O2341 Unspecified infection of urinary tract in pregnancy, first trimester: Secondary | ICD-10-CM | POA: Insufficient documentation

## 2013-06-30 DIAGNOSIS — J069 Acute upper respiratory infection, unspecified: Secondary | ICD-10-CM

## 2013-06-30 DIAGNOSIS — N390 Urinary tract infection, site not specified: Secondary | ICD-10-CM

## 2013-06-30 DIAGNOSIS — O3680X0 Pregnancy with inconclusive fetal viability, not applicable or unspecified: Secondary | ICD-10-CM

## 2013-06-30 DIAGNOSIS — O2390 Unspecified genitourinary tract infection in pregnancy, unspecified trimester: Secondary | ICD-10-CM

## 2013-06-30 DIAGNOSIS — B9789 Other viral agents as the cause of diseases classified elsewhere: Secondary | ICD-10-CM

## 2013-06-30 LAB — US OB COMP + 14 WK

## 2013-06-30 LAB — POCT URINALYSIS DIP (DEVICE)
Bilirubin Urine: NEGATIVE
Glucose, UA: NEGATIVE mg/dL
HGB URINE DIPSTICK: NEGATIVE
Ketones, ur: NEGATIVE mg/dL
Leukocytes, UA: NEGATIVE
Nitrite: NEGATIVE
PROTEIN: NEGATIVE mg/dL
SPECIFIC GRAVITY, URINE: 1.02 (ref 1.005–1.030)
UROBILINOGEN UA: 0.2 mg/dL (ref 0.0–1.0)
pH: 7 (ref 5.0–8.0)

## 2013-06-30 NOTE — Progress Notes (Signed)
Pulse: 80 Patient feels nauseated and isn't eating as much as she did before.

## 2013-06-30 NOTE — Progress Notes (Signed)
UTA FHTS w/ doppler. Will check w/ US. Fundal height 1/SP. Decreased appetite. Nausea from certain smells. Heartburn. No vomiting. Tums helps, but short acting.  Avoid triggers. Pepcid PRN. Ensure PRN. Reviewed NOB labs. Also reports URI w/ cough. No fever. Comfort measures, mucinex. Defer Flu vaccine until [place]. Taking Amox for UTI.

## 2013-06-30 NOTE — Progress Notes (Signed)
Informal US for FHR = 164 per PW doppler.  FM present during US.  Dorathy KinsmanVirginia Smith CNM notified.

## 2013-06-30 NOTE — Patient Instructions (Addendum)
Pepcid for heartburn Mucinex for cough  Medicamentos durante el embarazo  (Medicines During Pregnancy) Durante el embarazo, hay medicamentos que son seguros y otros no lo son. Entre los [employer] se [employer] recetados, los de [place], las cremas tpicas que se aplican en la piel y todos los medicamentos de hierbas. Los medicamentos se clasifican en clase A, B, C, o D. Los medicamentos de clase A y B han demostrado ser seguros en el Psychiatrist. Los medicamentos de clase C tambin se consideran seguros durante el [place], pero slo deben usarse cuando son necesarios. Los medicamentos clase D no deben Animator. Pueden ser dainos para un beb.  Lo mejor es Occupational hygienist cantidad posible de medicamentos durante el [place]. Sin embargo, algunos son necesarios para cuidar la salud del beb y Chief Strategy Officer. A veces, es ms peligroso dejar de tomar ciertos medicamentos que continuar usndolos. Este es el caso de las personas que sufren enfermedades de larga duracin (crnicas) como el asma, la diabetes o la presin arterial alta (hipertensin). Si est embarazada y sufre una enfermedad crnica, llame a su mdico de inmediato. Lleve una lista de los medicamentos que [place] y sus dosis a las visitas mdicas. Si usted est planeando quedar embarazada, programe una cita con el mdico e infrmele acerca de los medicamentos que toma.  Por ltimo, anote el nmero de telfono de Film/video editor. Podr responder preguntas sobre la clase a que pertenece un medicamento y su seguridad. No podr darle asesoramiento en cuanto a si debe o no debe tomar un medicamento.  MEDICAMENTOS SEGUROS Y NO SEGUROS  Hay una larga lista de medicamentos que se consideran seguros para Engineer, site. A continuacin se [place]. Para determinados medicamentos, consulte con su mdico.  Medicamentos para laalergia La loratadina, cetirizina y clorfeniramina son medicamentos  seguros. Algunos aerosoles nasales que contienen corticoides son seguros. Consulte a su mdico acerca de las marcas especficas que son seguras.  Analgsicos El paracetamol y el paracetamol con codena son seguros. Todos los otros medicamentos anti-inflamatorios no esteroideos (AINE) no son seguros. Aqu se incluye el ibuprofeno.  Anticidos  Muchos anticidos de venta libre son seguros para tomar. Consulte a su mdico acerca de las marcas especficas que son seguras. La famotidina, ranitidina, lansoprazol estn permitidos. El omeprazol se considera seguro para tomar en el segundo trimestre.  Antibiticos  Hay varios antibiticos que Energy manager. Estos incluyen tetraciclina, quinolonas y sulfas, pero puede haber otros. Consulte a su mdico antes de tomar cualquier antibitico.  Antihistamnicos  Consulte a su mdico acerca de las marcas especficas que son seguras.  Medicamentos para el asma  La mayora de los inhaladores con corticoides para el asma son seguros. Hable con su mdico para obtener informacin especfica.  Calcio  Los suplementos de calcio son seguros. No consuma calcio de ostras.  Medicamentos para la tos y el resfro  Es seguro que utilice productos que contienen guaifenesina o dextrometorfano. Consulte a su mdico acerca de las marcas especficas que son seguras. No es seguro tomar productos que contengan aspirina o ibuprofeno.  Medicamentos descongestivos Los productos que contienen pseudoefedrina son seguros para tomar en el segundo y Systems analyst trimestre.  Antidepresivos  Consulte a su mdico acerca de estos medicamentos.  Antidiarreicos  Es seguro tomar loperamida. Consulte a su mdico acerca de las marcas especficas que son seguras. No es seguro tomar medicamentos antidiarreicos que contengan bismuto.  Gotas oftlmicas  Las gotas oftlmicas para la Programmer, multimedia deben  limitarse.  Hierro  Es seguro usar en el embarazo ciertos medicamentos que contienen hierro para la  anemia. Ellos requieren [employer].  Medicamentos para las nauseas  Es seguro tomar doxilamina y vitamina B6 segn las indicaciones. Si fuera necesario, existen otros medicamentos con receta disponibles.  Pldoras para dormir  Es seguro tomar difenhidramina y paracetamol con difenhidramina.  Corticoides  Las cremas con hidrocortisona son seguras si se usan como se indica. Los corticoides por va oral requieren prescripcin mdica. No es Dietitian crema para las hemorroides que contengan pramoxina o fenilefrina.  Laxantes  Es seguro tomar laxantes. Evite su uso diario o prolongado.  Medicamentos para la tiroides  Es importante que siga usando el medicamento para la tiroides. Necesita ser controlada por su mdico.  Medicamentos para uso vaginal  El mdico le recetar un medicamento si usted tiene una infeccin vaginal. Ciertos medicamentos antifngicos son seguros si sufre una infeccin de transmisin sexual (ETS). Consulte a su mdico.  Document Released: 05/20/2005 Document Revised: 01/20/2013 [facility] Patient Information 2014 [place], [place].   Nuseas matinales (Morning Sickness) Se denominan nuseas matinales a las ganas de vomitar (nuseas) durante el Psychiatrist. Esta sensacin puede estar acompaada o no de vmitos. Aparecen por la maana, pero puede ser un problema a lo largo de [employer]. Las [place] son ms frecuentes Physicist, medical trimestre, Biomedical engineer pueden continuar durante todo el [place]. Aunque son molestas, generalmente no causan ningn dao, excepto que presente vmitos continuos e intensos (hiperemesis gravdica). Este problema requiere un tratamiento ms intenso.  CAUSAS  La causa de las nuseas matinales no se conoce completamente pero parecen estar relacionadas con los cambios hormonales que ocurren durante el [place]. FACTORES DE RIESGO Usted tendr mayor riesgo si:  Sufra de nuseas o vmitos antes de [place].  Tuvo  nuseas matinales durante los embarazos previos.  Est embarazada de ms de un beb, por ejemplo mellizos. TRATAMIENTO  No utilice ningn medicamento (prescripto, de venta libre ni hierbas) para este problema sin consultar con su mdico. El mdico tambin podr Camera operator o recomendar:  Suplementos de vitamina B6.  Medicamentos para las nauseas  La medicina herbal llamada jengibre. INSTRUCCIONES PARA EL CUIDADO EN EL HOGAR   Tome slo medicamentos de venta libre o recetados, segn las indicaciones del mdico.  Tomar un multivitamnico antes de Scientist, research (physical sciences) puede prevenir o disminuir la gravedad de las nuseas matinales en la mayora de las mujeres.  Coma un trozo de [place] seca o una cracker sin sal antes de levantarse de la cama por la maana.  Coma 5 o 6 comidas pequeas por da.  Consuma alimentos blandos y secos (arroz, papas asadas). Los alimentos ricos en hidratos de carbono generalmente ayudan.  No  beba lquidos con las comidas. Tome lquidos [employer].  Evite los alimentos muy grasos o condimentados.  Pdale a otra persona que cocine para usted si [employer] de algn alimento le provoca nuseas o vmitos.  Si tiene ganas de vomitar despus de tomar las vitaminas prenatales, tmelas a la noche o con una colacin.  Tome colaciones de alimentos proteicos entre comidas si siente apetito.  Coma gelatina sin azcar de postre.  Una pulsera de acupresin ( que se utiliza para Research scientist (life sciences) en viajes) puede ser de [place].  La acupuntura puede ayudarla.  No fume.  Consiga un humidificador para Customer service manager de su casa libre de [place].  Trate de respirar aire fresco. SOLICITE ATENCIN MDICA SI:   Los remedios  caseros no funcionan y necesita medicamentos.  Se siente mareada o sufre un desmayo.  Pierde peso. SOLICITE ATENCIN MDICA DE INMEDIATO SI:   Tiene nuseas y vmitos de manera persistente y no puede controlarlos.  Pierde el conocimiento (se  desmaya). Document Released: 09/05/2008 Document Revised: 01/20/2013 [facility] Patient Information 2014 [place], [place].

## 2013-07-08 ENCOUNTER — Ambulatory Visit: Payer: Self-pay | Attending: Internal Medicine

## 2013-07-28 ENCOUNTER — Ambulatory Visit (INDEPENDENT_AMBULATORY_CARE_PROVIDER_SITE_OTHER): Payer: No Typology Code available for payment source | Admitting: Advanced Practice Midwife

## 2013-07-28 VITALS — BP 115/74 | Temp 97.6°F | Wt 112.3 lb

## 2013-07-28 DIAGNOSIS — O2390 Unspecified genitourinary tract infection in pregnancy, unspecified trimester: Secondary | ICD-10-CM

## 2013-07-28 DIAGNOSIS — N390 Urinary tract infection, site not specified: Secondary | ICD-10-CM

## 2013-07-28 DIAGNOSIS — Z3480 Encounter for supervision of other normal pregnancy, unspecified trimester: Secondary | ICD-10-CM

## 2013-07-28 DIAGNOSIS — Z23 Encounter for immunization: Secondary | ICD-10-CM

## 2013-07-28 DIAGNOSIS — O2341 Unspecified infection of urinary tract in pregnancy, first trimester: Secondary | ICD-10-CM

## 2013-07-28 LAB — POCT URINALYSIS DIP (DEVICE)
Bilirubin Urine: NEGATIVE
Glucose, UA: NEGATIVE mg/dL
HGB URINE DIPSTICK: NEGATIVE
Ketones, ur: NEGATIVE mg/dL
Leukocytes, UA: NEGATIVE
NITRITE: NEGATIVE
PH: 7 (ref 5.0–8.0)
PROTEIN: NEGATIVE mg/dL
Specific Gravity, Urine: 1.02 (ref 1.005–1.030)
UROBILINOGEN UA: 0.2 mg/dL (ref 0.0–1.0)

## 2013-07-28 NOTE — Progress Notes (Signed)
P= 78 C/o of intermittent, mild lower abdominal/pelvic pressure.  Flu vaccine today.

## 2013-07-28 NOTE — Progress Notes (Signed)
Pt states that she has mild lower abdominal pain that feels like stretching.  She doesn't feel that it's severe enough for medication.  Reports +FM. Denies LOF, VB, contractions.  Received flu shot today. Scheduled anatomy U/S.

## 2013-07-29 NOTE — Progress Notes (Signed)
I have seen this patient today and agree with the previous midwife student's note.  LEFTWICH-KIRBY, Merrianne Mccumbers Certified Nurse-Midwife 

## 2013-08-11 ENCOUNTER — Ambulatory Visit (HOSPITAL_COMMUNITY)
Admission: RE | Admit: 2013-08-11 | Discharge: 2013-08-11 | Disposition: A | Discharge status: Home / Self Care | Payer: No Typology Code available for payment source | Source: Ambulatory Visit | Attending: Advanced Practice Midwife | Admitting: Advanced Practice Midwife

## 2013-08-11 ENCOUNTER — Other Ambulatory Visit: Payer: Self-pay | Admitting: Advanced Practice Midwife

## 2013-08-11 ENCOUNTER — Ambulatory Visit (INDEPENDENT_AMBULATORY_CARE_PROVIDER_SITE_OTHER): Payer: No Typology Code available for payment source | Admitting: Advanced Practice Midwife

## 2013-08-11 VITALS — BP 100/63 | Temp 98.3°F | Wt 113.5 lb

## 2013-08-11 DIAGNOSIS — Z3480 Encounter for supervision of other normal pregnancy, unspecified trimester: Secondary | ICD-10-CM

## 2013-08-11 DIAGNOSIS — Z3689 Encounter for other specified antenatal screening: Secondary | ICD-10-CM | POA: Insufficient documentation

## 2013-08-11 NOTE — Progress Notes (Signed)
[phone] 

## 2013-08-11 NOTE — Progress Notes (Signed)
Doing well.  Good fetal movement, denies vaginal bleeding, LOF, regular contractions.  Some intermittent lower abdominal pain a few times /day, more when busy, walking or going up stairs.  Used interpreter line for all communication.  Recommend increase PO fluid, rest as needed.

## 2013-09-17 ENCOUNTER — Encounter: Payer: Self-pay | Admitting: Obstetrics and Gynecology

## 2013-09-17 ENCOUNTER — Ambulatory Visit (INDEPENDENT_AMBULATORY_CARE_PROVIDER_SITE_OTHER): Payer: No Typology Code available for payment source | Admitting: Obstetrics and Gynecology

## 2013-09-17 VITALS — BP 107/74 | Temp 98.3°F | Wt 120.1 lb

## 2013-09-17 DIAGNOSIS — Z3480 Encounter for supervision of other normal pregnancy, unspecified trimester: Secondary | ICD-10-CM

## 2013-09-17 DIAGNOSIS — Z3490 Encounter for supervision of normal pregnancy, unspecified, unspecified trimester: Secondary | ICD-10-CM

## 2013-09-17 DIAGNOSIS — E639 Nutritional deficiency, unspecified: Secondary | ICD-10-CM | POA: Insufficient documentation

## 2013-09-17 LAB — POCT URINALYSIS DIP (DEVICE)
Bilirubin Urine: NEGATIVE
Glucose, UA: NEGATIVE mg/dL
Hgb urine dipstick: NEGATIVE
Ketones, ur: NEGATIVE mg/dL
LEUKOCYTES UA: NEGATIVE
NITRITE: NEGATIVE
PH: 7.5 (ref 5.0–8.0)
PROTEIN: NEGATIVE mg/dL
Specific Gravity, Urine: 1.015 (ref 1.005–1.030)
UROBILINOGEN UA: 0.2 mg/dL (ref 0.0–1.0)

## 2013-09-17 MED ORDER — PRENATAL PLUS 27-1 MG PO TABS
1.0000 | ORAL_TABLET | Freq: Every day | ORAL | Status: AC
Start: 2013-09-17 — End: ?

## 2013-09-17 NOTE — Patient Instructions (Signed)
Segundo trimestre del embarazo  (Second Trimester of Pregnancy) El segundo trimestre del embarazo se extiende desde la semana 13 hasta la semana 28, del 4 al 6 mes. En general, es el momento del embarazo en el que se sentir mejor. Su organismo se ha adaptado a estar embarazada y comienza a sentirse fsicamente mejor. En general las nuseas matutinas han disminuido o han desaparecido completamente. El segundo trimestre es tambin la poca en la que el feto se desarrolla rpidamente. Hacia el final del sexto mes, el beb mide aproximadamente 9 pulgadas (23 cm) y pesa alrededor de 1  libras (700 g). Es probable que sienta mover al beb (dar pataditas) entre las 18 y 20 semanas del embarazo.  CAMBIOS CORPORALES  Su organismo atravesar numerosos cambios durante el embarazo. Los cambios varan de una mujer a otra.   Seguir aumentando de peso. Notar que la parte baja del abdomen se ensancha.  Podrn aparecer las primeras estras en las caderas, abdomen y mamas.  Es posible que sienta cefaleas, que se pueden aliviar con los medicamentos que su mdico le autorice a utilizar.  Tendr necesidad de orinar con ms frecuencia porque el feto est presionando en la vejiga.  Como consecuencia del embarazo, podr sentir acidez estomacal continuamente.  Podr estar constipada ya que ciertas hormonas hacen que los msculos que empujan los desechos a travs de los intestinos trabajen ms lentamente.  Pueden aparecer hemorroides o abultarse las venas (venas varicosas).  El dolor de espalda se debe al aumento de peso y a que las hormonas del embarazo relajan las articulaciones entre los huesos de la pelvis y a la modificacin del peso y a los msculos que soportan el equilibrio.  Sus mamas seguirn desarrollndose y estarn ms sensibles.  Las encas pueden sangrar y estar sensibles al cepillado y al hilo dental.  Pueden aparecer en el rostro zonas oscuras o manchas (cloasma, mscara del embarazo). Esto  desaparece despus del nacimiento del beb.  Es posible que se formeuna lnea oscura desde el ombligo a la zona del pubis (linea nigra). Esto desaparece despus del nacimiento del beb. QU DEBE ESPERAR EN LAS CONSULTAS PRENATALES  Durante una visita prenatal de rutina:   La pesarn para verificar que usted y el feto se encuentran dentro de los lmites normales.  Le tomarn la presin arterial.  Le medirn el abdomen para verificar el desarrollo del beb.  Escucharn los latidos fetales.  Se evaluarn los resultados de los estudios realizados en visitas anteriores. El mdico le preguntar:   Cmo se siente.  Si siente los movimientos del beb.  Si tiene sntomas anormales, como prdida de lquido, sangrado, dolores de cabeza intensos o clicos abdominales.  Si tiene alguna duda. Otros estudios que podrn realizarse durante el segundo trimestre son:   Anlisis de sangre para evaluar:  Niveles bajos de hierro (anemia).  Diabetes gestacional (entre las 24 y las 28 semanas).  Anticuerpos Rh.  Anlisis de orina para detectar infecciones, diabetes o protenas en la orina.  Una ecografa para confirmar si el crecimiento y el desarrollo del beb son los adecuados.  Una amniocentesis para diagnosticar posibles problemas genticos.  Estudios del feto para descartar espina bfida y sndrome de Down. INSTRUCCIONES PARA EL CUIDADO EN EL HOGAR   Evite fumar, consumir hierbas, beber alcohol y utilizar frmacos que no ne hayan recetado. Estas sustancias qumicas afectan la formacin y el desarrollo del beb.  Siga las indicaciones del profesional con respecto a como tomar los medicamentos. Durante el embarazo, hay   medicamentos que son seguros y otros no lo son.  Realice actividad fsica slo segn las indicaciones del mdico. Sentir clicos uterinos es el mejor signo para detener la actividad fsica.  Contine haciendo comidas regulares y sanas.  Use un sostn que le brinde buen  soporte si sus mamas estn sensibles.  No utilice la baera con agua caliente, baos turcos y saunas.  Colquese el cinturn de seguridad cuando conduzca.  Evite comer carne cruda y el contacto con los utensilios y desperdicios de los gatos. Estos elementos contienen grmenes que pueden causar defectos de nacimiento en el beb.  Tome las vitaminas indicadas para la etapa prenatal.  Pruebe un laxante (si el mdico la autoriza) si tiene constipacin. Consuma ms alimentos ricos en fibra, como vegetales y frutas frescos y cereales enteros. Beba gran cantidad de lquido para mantener la orina de tono claro o amarillo plido.  Tome baos de agua tibia para calmar el dolor o las molestias causadas por las hemorroides. Use una crema para las hemorroides si el mdico la autoriza.  Si tiene venas varicosas, use medias de soporte. Eleve los pies durante 15 minutos, 3 o 4 veces por da. Limite el consumo de sal en su dieta.  Evite levantar objetos pesados, use zapatos de tacones bajos y mantenga una buena postura.  Descanse con las piernas elevadas si tiene calambres o dolor de cintura.  Visite a su dentista si no lo ha hecho durante el embarazo. Use un cepillo de dientes blando para higienizarse los dientes y use suavemente el hilo dental.  Puede continuar su vida sexual siempre que el mdico la autorice.  Concurra a todas las visitas prenatales segn las indicaciones de su mdico. SOLICITE ATENCIN MDICA SI:   Tiene mareos.  Siente clicos leves, presin en la pelvis o dolor persistente en el abdomen.  Tiene nuseas o vmitos o diarrea persistentes.  Observa una secrecin vaginal con mal olor.  Siente dolor al orinar. SOLICITE ATENCIN MDICA DE INMEDIATO SI:   Tiene fiebre.  Pierde lquido por la vagina.  Tiene sangrando o pequeas prdidas vaginales.  Siente dolor intenso o clicos en el abdomen.  Sube o baja de peso rpidamente.  Tiene dificultad para respirar y le duele  pecho.  Sbitamente se le hincha el rostro, las manos, los tobillos, los pies o las piernas de manera extrema.  No ha sentido los movimientos del beb durante una hora.  Siente un dolor de cabeza intenso que no se alivia con medicamentos.  Su visin se modifica. Document Released: 02/27/2005 Document Revised: 01/20/2013 Exit[employer] Patient Information 2014 [employer].  

## 2013-09-17 NOTE — Progress Notes (Signed)
Now gaining weight well and S=D. Hx AGA.  Good FM. Reviewed danger signs and visit schedule, recommended breastfeeding.

## 2013-09-17 NOTE — Progress Notes (Signed)
Pulse: 89

## 2013-10-15 ENCOUNTER — Ambulatory Visit (INDEPENDENT_AMBULATORY_CARE_PROVIDER_SITE_OTHER): Payer: No Typology Code available for payment source | Admitting: Obstetrics and Gynecology

## 2013-10-15 VITALS — BP 105/74 | HR 82 | Temp 98.0°F | Wt 122.9 lb

## 2013-10-15 DIAGNOSIS — R55 Syncope and collapse: Secondary | ICD-10-CM

## 2013-10-15 DIAGNOSIS — Z23 Encounter for immunization: Secondary | ICD-10-CM

## 2013-10-15 DIAGNOSIS — Z3480 Encounter for supervision of other normal pregnancy, unspecified trimester: Secondary | ICD-10-CM

## 2013-10-15 LAB — POCT URINALYSIS DIP (DEVICE)
Bilirubin Urine: NEGATIVE
Glucose, UA: NEGATIVE mg/dL
Hgb urine dipstick: NEGATIVE
Ketones, ur: NEGATIVE mg/dL
LEUKOCYTES UA: NEGATIVE
Nitrite: NEGATIVE
PROTEIN: NEGATIVE mg/dL
SPECIFIC GRAVITY, URINE: 1.015 (ref 1.005–1.030)
UROBILINOGEN UA: 0.2 mg/dL (ref 0.0–1.0)
pH: 7.5 (ref 5.0–8.0)

## 2013-10-15 LAB — CBC
HCT: 32.9 % — ABNORMAL LOW (ref 36.0–46.0)
Hemoglobin: 11.2 g/dL — ABNORMAL LOW (ref 12.0–15.0)
MCH: 30 pg (ref 26.0–34.0)
MCHC: 34 g/dL (ref 30.0–36.0)
MCV: 88.2 fL (ref 78.0–100.0)
PLATELETS: 269 10*3/uL (ref 150–400)
RBC: 3.73 MIL/uL — ABNORMAL LOW (ref 3.87–5.11)
RDW: 11.8 % (ref 11.5–15.5)
WBC: 9.3 10*3/uL (ref 4.0–10.5)

## 2013-10-15 MED ORDER — TETANUS-DIPHTH-ACELL PERTUSSIS 5-2.5-18.5 LF-MCG/0.5 IM SUSP
0.5000 mL | Freq: Once | INTRAMUSCULAR | Status: AC
Start: 2013-10-15 — End: 2013-10-15
  Administered 2013-10-15: 0.5 mL via INTRAMUSCULAR

## 2013-10-15 NOTE — Progress Notes (Signed)
Had presyncopal episode a few days ago, no LOC. No previous episodes. Asymptomatic and denies anxiety at present. Glucola and 1 hr OGTT today. Lungs CTA. Cor RRR w/o m. Discussed small frequent meals, PNV and iron-rich foods. TDAP today.

## 2013-10-15 NOTE — Progress Notes (Signed)
States she has been feeling like it is difficult to breathe. On Tuesday the feeling got so intense she passed out. States she has to eat small meals otherwise she feels like she is going to suffocate; has anxiety over this  o2 sat 99%

## 2013-10-15 NOTE — Patient Instructions (Addendum)
Tercer trimestre del embarazo  (Third Trimester of Pregnancy) El tercer trimestre del embarazo abarca desde la semana 29 hasta la semana 42, desde el 7 mes hasta el 9. En este trimestre el feto se desarrolla muy rpidamente. Hacia el final del noveno mes, el beb que an no ha nacido mide alrededor de 20 pulgadas (45 cm) de largo y pesa entre 6 y 10 libras (2,700 y 4,500 kg).  CAMBIOS CORPORALES  Su organismo atravesar numerosos cambios durante el embarazo. Los cambios varan de una mujer a otra.   Seguir aumentando de peso. Es esperable que aumente entre 25 y 35 libras (11 16 kg) hacia el final del embarazo.  Podrn aparecer las primeras estras en las caderas, abdomen y mamas.  Tendr necesidad de orinar con ms frecuencia porque el feto baja hacia la pelvis y presiona en la vejiga.  Como consecuencia del embarazo, podr sentir acidez estomacal continuamente.  Podr estar constipada ya que ciertas hormonas hacen que los msculos que hacen progresar los desechos a travs de los intestinos trabajen ms lentamente.  Pueden aparecer hemorroides o abultarse e hincharse las venas (venas varicosas).  Podr sentir dolor plvico debido al aumento de peso ya que las hormonas del embarazo relajan las articulaciones entre los huesos de la pelvis. El dolor de espalda puede ser consecuencia de la exigencia de los msculos que soportan la postura.  Sus mamas seguirn desarrollndose y estarn ms sensibles. A veces sale una secrecin amarilla de las mamas, que se llama calostro.  El ombligo puede salir hacia afuera.  Podr sentir que le falta el aire debido a que se expande el tero.  Podr notar que el feto "baja" o que se siente ms bajo en el abdomen.  Podr tener una prdida de secrecin mucosa con sangre. Esto suele ocurrir entre unos pocos das y una semana antes del parto.  El cuello se vuelve delgado y blando (se borra) cerca de la fecha de parto. QU DEBE ESPERAR EN LAS CONSULTAS  PRENATALES  Le harn exmenes prenatales cada 2 semanas hasta la semana 36. A partir de ese momento le harn exmenes semanales. Durante una visita prenatal de rutina:   La pesarn para verificar que usted y el feto se encuentran dentro de los lmites normales.  Le tomarn la presin arterial.  Le medirn el abdomen para verificar el desarrollo del beb.  Escucharn los latidos fetales.  Se evaluarn los resultados de los estudios solicitados en visitas anteriores.  Le controlarn el cuello del tero cuando est prxima la fecha de parto para ver si se ha borrado. Alrededor de la semana 36 el mdico controlar el cuello del tero. Al mismo tiempo realizar un anlisis de las secreciones del tejido vaginal. Este examen es para determinar si hay un tipo de bacteria, estreptococo Grupo B. El mdico le explicar esto con ms detalle.  El mdico podr preguntarle:   Como le gustara que fuera el parto.  Cmo se siente.  Si siente los movimientos del beb.  Si tiene sntomas anormales, como prdida de lquido, sangrado, dolores de cabeza intenso o clicos abdominales.  Si tiene alguna duda. Otros estudios que podrn realizarse durante el tercer trimestre son:   Anlisis de sangre para controlar sus niveles de hierro (anemia).  Controles fetales para determinar su salud, el nivel de actividad y su desarrollo. Si tiene alguna enfermedad o si tuvo problemas durante el embarazo, le harn estudios. FALSO TRABAJO DE PARTO  Es posible que sienta contracciones pequeas e irregulares que finalmente   desaparecen. Se llaman contracciones de Braxton Hicks o falso trabajo de parto. Las contracciones pueden durar horas, das o an semanas antes de que el verdadero trabajo de parto se inicie. Si las contracciones tienen intervalos regulares, se intensifican o se hacen dolorosas, lo mejor es que la revise su mdico.  SIGNOS DE TRABAJO DE PARTO   Espasmos del tipo menstrual.  Contracciones cada 5  minutos o menos.  Contracciones que comienzan en la parte superior del tero y se expanden hacia abajo, a la zona inferior del abdomen y la espalda.  Sensacin de presin que aumenta en la pelvis o dolor en la espalda.  Aparece una secrecin acuosa o sanguinolenta por la vagina. Si tiene alguno de estos signos antes de la semana 37 del embarazo, llame a su mdico inmediatamente. Debe concurrir al hospital para ser controlada inmediatamente.  INSTRUCCIONES PARA EL CUIDADO EN EL HOGAR   Evite fumar, consumir hierbas, beber alcohol y utilizar frmacos que no le hayan recetado. Estas sustancias qumicas afectan la formacin y el desarrollo del beb.  Siga las indicaciones del profesional con respecto a como tomar los medicamentos. Durante el embarazo, hay medicamentos que son seguros y otros no lo son.  Realice actividad fsica slo segn las indicaciones del mdico. Sentir clicos uterinos es el mejor signo para detener la actividad fsica.  Contine haciendo comidas regulares y sanas.  Use un sostn que le brinde buen soporte si sus mamas estn sensibles.  No utilice la baera con agua caliente, baos turcos o saunas.  Colquese el cinturn de seguridad cuando conduzca.  Evite comer carne cruda queso sin cocinar y el contacto con los utensilios y desperdicios de los gatos. Estos elementos contienen grmenes que pueden causar defectos de nacimiento en el beb.  Tome las vitaminas indicadas para la etapa prenatal.  Pruebe un laxante (si el mdico la autoriza) si tiene constipacin. Consuma ms alimentos ricos en fibra, como vegetales y frutas frescos y cereales enteros. Beba gran cantidad de lquido para mantener la orina de tono claro o amarillo plido.  Tome baos de agua tibia para calmar el dolor o las molestias causadas por las hemorroides. Use una crema para las hemorroides si el mdico la autoriza.  Si tiene venas varicosas, use medias de soporte. Eleve los pies durante 15 minutos,  3 4 veces por da. Limite el consumo de sal en su dieta.  Evite levantar objetos pesados, use zapatos de tacones bajos y mantenga una buena postura.  Descanse con las piernas elevadas si tiene calambres o dolor de cintura.  Visite a su dentista si no lo ha hecho durante el embarazo. Use un cepillo de dientes blando para higienizarse los dientes y use suavemente el hilo dental.  Puede continuar su vida sexual excepto que el mdico le indique otra cosa.  No haga viajes largos excepto que sea absolutamente necesario y slo con la aprobacin de su mdico.  Tome clases prenatales para entender, practicar y hacer preguntas sobre el trabajo de parto y el alumbramiento.  Haga un ensayo sobre la partida al hospital.  Prepare el bolso que llevar al hospital.  Prepare la habitacin del beb.  Contine concurriendo a todas las visitas prenatales segn las indicaciones de su mdico. SOLICITE ATENCIN MDICA SI:   No est segura si est en trabajo de parto o ha roto la bolsa de aguas.  Tiene mareos.  Siente clicos leves, presin en la pelvis o dolor persistente en el abdomen.  Tiene nuseas o vmitos persistentes.  Observa una   secrecin vaginal con mal olor.  Siente dolor al ConocoPhillipsorinar. SOLICITE ATENCIN MDICA DE INMEDIATO SI:   Tiene fiebre.  Pierde lquido o sangre por la vagina.  Tiene sangrado o pequeas prdidas vaginales.  Siente dolor intenso o clicos en el abdomen.  Sube o baja de peso rpidamente.  Le falta el aire y le duele el pecho al respirar.  Sbitamente se le hincha el rostro, las manos, los tobillos, los pies o las piernas de [place] New Marketmanera extrema.  No ha sentido los movimientos del beb durante Georgianne Fickuna hora.  Siente un dolor de cabeza intenso que no se alivia con medicamentos.  Su visin se modifica. Document Released: 02/27/2005 Document Revised: 01/20/2013 [facility] LLCExitCare Patient Information 2014 [place]ExitCare, [place]. Dieta rica en hierro (Iron-Rich Diet) Una dieta rica en  hierro est compuesta por alimentos que tienen buena cantidad del mismo. El hierro es un mineral importante que se utiliza para formar hemoglobina. La hemoglobina es una protena necesaria para que los glbulos rojos puedan transportar el oxgeno por todo el organismo. El nivel de hierro en sangre puede disminuir por no consumir:  El hierro suficiente en la dieta, por prdidas de Jackpotsangre.  Prdidas de sangre.  Momentos que implican desarrollo, como durante el embarazo o durante el crecimiento y desarrollo de un nio. Niveles bajos de hierro pueden causar una disminucin del nmero de glbulos rojos. El resultado puede ser una anemia por dficit de hierro. Los sntomas de la anemia por dficit de hierro son:   Harrel LemonFalta de Engineer, drillingenerga.  Debilidad.  Irritabilidad.  Aumento de la probabilidad de infecciones adems. Estas son algunas recomendaciones para la ingesta diaria de hierro.   Los varones de ms de 19 aos necesitan 8 mg de hierro Googlepor da.  Las Lexmark Internationalmujeres entre los 19 y los 50 aos necesitan 18 mg de hierro por Futures traderda.  Las mujeres embarazadas necesitan 27 mg de hierro Googlepor da, y AutoZonelas mayores de 19 aos que estn amamantando necesitan 9 mg de hierro por C.H. Robinson Worldwideda.  Las Coca Colamujeres mayores de 50 aos necesitan 8 mg de hierro Googlepor da. FUENTES DE HIERRO Hay dos tipos de hierro presentes en los alimentos: hierro hem y no hem. El hierro hem es mejor absorbido en el organismo que el no hem. El hierro hem se encuentra en la carne, el pollo y el pescado. El hierro no heme se Consolidated Edisonencuentra en los granos, los porotos y los vegetales. Fuentes de hierro hem Alimento / Hierro (mg)  3 oz (85 gr.) de hgado de pollo / 10 mg  3 oz (85 gr.) de hgado de vaca / 5.5 mg  3 oz (85 gr.) de ostras / 8 mg  3 oz (85 gr.) de carne / 2-3 mg  3 oz (85 gr.) de langostinos / 2.8 mg  3 oz (85 gr.) de pavo / 2 mg  3 oz (85 gr.) de pollo / 1 mg  3 oz (85 gr.) de pescado (atn, halibut) / 1 mg  3 oz (85 gr.) de cerdo / 0.9  mg Fuentes de hierro no heme Alimento / Hierro (mg)  Cereal listo para consumir, fortificado con hierro / 3.9-7 mg   taza de tofu / 3.4 mg   taza de frijoles / 2.6 mg  Patatas al horno con piel / 2.7 mg   taza de esprragos / 2.2 mg  [place] / 2 mg   taza de duraznos disecados / 1.6 mg   taza de pasas de uva / 1.5 mg  1 taza de leche de soja /  1.5 mg  1 rebanada de pan integral / 1.2 mg  1 taza de espinacas / 0.8 mg   taza de brcoli / 0.6 mg LA ABSORCIN DEL HIERRO Ciertos alimentos disminuyen la absorcin del hierro en el organismo. Trate de evitar estos alimentos y bebidas cuando consuma una dieta rica en hierro:  Caf.  Forrestine Him.  Fibras.  Soja. Los alimentos que contienen vitamina C ayudan a aumentar la cantidad de hierro que el organismo absorbe, en especial la de las fuentes de hierro no heme. Consuma alimentos ricos en vitamina C junto con alimentos que contengan hierro para aumentar su absorcin. Los alimentos con alto contenido de vitamina C incluyen una variedad de frutas y Sports administratorvegetales. Buenas fuentes de vitamina C son:  Marcell AngerJugo de Probation officernaranjas fresco.  WenonahNaranjas.  Jinny SandersFresas.  Mangos.  Toronjas.  Pimientos rojos.  Pimientos verdes.  Brcoli.  Patatas con piel.  Jugo de tomates. Document Released: 03/06/2006 Document Revised: 08/12/2011 [facility]Exit[facility] Patient Information 2014 [place]ExitCare, MarylandLLC.

## 2013-10-16 LAB — HIV ANTIBODY (ROUTINE TESTING W REFLEX): HIV 1&2 Ab, 4th Generation: NONREACTIVE

## 2013-10-16 LAB — GLUCOSE TOLERANCE, 1 HOUR (50G) W/O FASTING: GLUCOSE 1 HOUR GTT: 113 mg/dL (ref 70–140)

## 2013-10-16 LAB — RPR

## 2013-10-29 ENCOUNTER — Ambulatory Visit (INDEPENDENT_AMBULATORY_CARE_PROVIDER_SITE_OTHER): Payer: No Typology Code available for payment source | Admitting: Family

## 2013-10-29 VITALS — BP 104/70 | HR 82 | Temp 96.8°F | Wt 122.9 lb

## 2013-10-29 DIAGNOSIS — Z3480 Encounter for supervision of other normal pregnancy, unspecified trimester: Secondary | ICD-10-CM

## 2013-10-29 LAB — POCT URINALYSIS DIP (DEVICE)
Bilirubin Urine: NEGATIVE
Glucose, UA: NEGATIVE mg/dL
HGB URINE DIPSTICK: NEGATIVE
Ketones, ur: NEGATIVE mg/dL
Leukocytes, UA: NEGATIVE
Nitrite: NEGATIVE
PROTEIN: NEGATIVE mg/dL
SPECIFIC GRAVITY, URINE: 1.015 (ref 1.005–1.030)
UROBILINOGEN UA: 0.2 mg/dL (ref 0.0–1.0)
pH: 8.5 — ABNORMAL HIGH (ref 5.0–8.0)

## 2013-10-29 NOTE — Progress Notes (Signed)
Doing well; no questions or concerns.  Denies additional syncopal episodes.  Reviewed 1 hr results.

## 2013-11-16 ENCOUNTER — Encounter: Payer: Self-pay | Admitting: Obstetrics and Gynecology

## 2013-11-16 ENCOUNTER — Ambulatory Visit (INDEPENDENT_AMBULATORY_CARE_PROVIDER_SITE_OTHER): Payer: No Typology Code available for payment source | Admitting: Obstetrics and Gynecology

## 2013-11-16 VITALS — BP 111/76 | HR 103 | Temp 98.3°F | Wt 125.1 lb

## 2013-11-16 DIAGNOSIS — Z3480 Encounter for supervision of other normal pregnancy, unspecified trimester: Secondary | ICD-10-CM

## 2013-11-16 DIAGNOSIS — Z3483 Encounter for supervision of other normal pregnancy, third trimester: Secondary | ICD-10-CM

## 2013-11-16 LAB — POCT URINALYSIS DIP (DEVICE)
Bilirubin Urine: NEGATIVE
GLUCOSE, UA: NEGATIVE mg/dL
HGB URINE DIPSTICK: NEGATIVE
Ketones, ur: NEGATIVE mg/dL
Leukocytes, UA: NEGATIVE
NITRITE: NEGATIVE
PH: 6.5 (ref 5.0–8.0)
Protein, ur: NEGATIVE mg/dL
Specific Gravity, Urine: 1.02 (ref 1.005–1.030)
UROBILINOGEN UA: 0.2 mg/dL (ref 0.0–1.0)

## 2013-11-16 NOTE — Progress Notes (Signed)
Doing well. No upper abd pain. Discussed RLP. Advised LARC> information on Nexplanon.

## 2013-11-16 NOTE — Patient Instructions (Addendum)
Tercer trimestre del embarazo  (Third Trimester of Pregnancy) El tercer trimestre del embarazo abarca desde la semana 29 hasta la semana 42, desde el 7 mes hasta el 9. En este trimestre el feto se desarrolla muy rpidamente. Hacia el final del noveno mes, el beb que an no ha nacido mide alrededor de 20 pulgadas (45 cm) de largo y pesa entre 6 y 10 libras (2,700 y 4,500 kg).  CAMBIOS CORPORALES  Su organismo atravesar numerosos cambios durante el embarazo. Los cambios varan de una mujer a otra.   Seguir aumentando de peso. Es esperable que aumente entre 25 y 35 libras (11 16 kg) hacia el final del embarazo.  Podrn aparecer las primeras estras en las caderas, abdomen y mamas.  Tendr necesidad de orinar con ms frecuencia porque el feto baja hacia la pelvis y presiona en la vejiga.  Como consecuencia del embarazo, podr sentir acidez estomacal continuamente.  Podr estar constipada ya que ciertas hormonas hacen que los msculos que hacen progresar los desechos a travs de los intestinos trabajen ms lentamente.  Pueden aparecer hemorroides o abultarse e hincharse las venas (venas varicosas).  Podr sentir dolor plvico debido al aumento de peso ya que las hormonas del embarazo relajan las articulaciones entre los huesos de la pelvis. El dolor de espalda puede ser consecuencia de la exigencia de los msculos que soportan la postura.  Sus mamas seguirn desarrollndose y estarn ms sensibles. A veces sale una secrecin amarilla de las mamas, que se llama calostro.  El ombligo puede salir hacia afuera.  Podr sentir que le falta el aire debido a que se expande el tero.  Podr notar que el feto "baja" o que se siente ms bajo en el abdomen.  Podr tener una prdida de secrecin mucosa con sangre. Esto suele ocurrir entre unos pocos das y una semana antes del parto.  El cuello se vuelve delgado y blando (se borra) cerca de la fecha de parto. QU DEBE ESPERAR EN LAS CONSULTAS  PRENATALES  Le harn exmenes prenatales cada 2 semanas hasta la semana 36. A partir de ese momento le harn exmenes semanales. Durante una visita prenatal de rutina:   La pesarn para verificar que usted y el feto se encuentran dentro de los lmites normales.  Le tomarn la presin arterial.  Le medirn el abdomen para verificar el desarrollo del beb.  Escucharn los latidos fetales.  Se evaluarn los resultados de los estudios solicitados en visitas anteriores.  Le controlarn el cuello del tero cuando est prxima la fecha de parto para ver si se ha borrado. Alrededor de la semana 36 el mdico controlar el cuello del tero. Al mismo tiempo realizar un anlisis de las secreciones del tejido vaginal. Este examen es para determinar si hay un tipo de bacteria, estreptococo Grupo B. El mdico le explicar esto con ms detalle.  El mdico podr preguntarle:   Como le gustara que fuera el parto.  Cmo se siente.  Si siente los movimientos del beb.  Si tiene sntomas anormales, como prdida de lquido, sangrado, dolores de cabeza intenso o clicos abdominales.  Si tiene alguna duda. Otros estudios que podrn realizarse durante el tercer trimestre son:   Anlisis de sangre para controlar sus niveles de hierro (anemia).  Controles fetales para determinar su salud, el nivel de actividad y su desarrollo. Si tiene alguna enfermedad o si tuvo problemas durante el embarazo, le harn estudios. FALSO TRABAJO DE PARTO  Es posible que sienta contracciones pequeas e irregulares que finalmente   desaparecen. Se llaman contracciones de Braxton Hicks o falso trabajo de parto. Las contracciones pueden durar horas, das o an semanas antes de que el verdadero trabajo de parto se inicie. Si las contracciones tienen intervalos regulares, se intensifican o se hacen dolorosas, lo mejor es que la revise su mdico.  SIGNOS DE TRABAJO DE PARTO   Espasmos del tipo menstrual.  Contracciones cada 5  minutos o menos.  Contracciones que comienzan en la parte superior del tero y se expanden hacia abajo, a la zona inferior del abdomen y la espalda.  Sensacin de presin que aumenta en la pelvis o dolor en la espalda.  Aparece una secrecin acuosa o sanguinolenta por la vagina. Si tiene alguno de estos signos antes de la semana 37 del embarazo, llame a su mdico inmediatamente. Debe concurrir al hospital para ser controlada inmediatamente.  INSTRUCCIONES PARA EL CUIDADO EN EL HOGAR   Evite fumar, consumir hierbas, beber alcohol y utilizar frmacos que no le hayan recetado. Estas sustancias qumicas afectan la formacin y el desarrollo del beb.  Siga las indicaciones del profesional con respecto a como tomar los medicamentos. Durante el embarazo, hay medicamentos que son seguros y otros no lo son.  Realice actividad fsica slo segn las indicaciones del mdico. Sentir clicos uterinos es el mejor signo para detener la actividad fsica.  Contine haciendo comidas regulares y sanas.  Use un sostn que le brinde buen soporte si sus mamas estn sensibles.  No utilice la baera con agua caliente, baos turcos o saunas.  Colquese el cinturn de seguridad cuando conduzca.  Evite comer carne cruda queso sin cocinar y el contacto con los utensilios y desperdicios de los gatos. Estos elementos contienen grmenes que pueden causar defectos de nacimiento en el beb.  Tome las vitaminas indicadas para la etapa prenatal.  Pruebe un laxante (si el mdico la autoriza) si tiene constipacin. Consuma ms alimentos ricos en fibra, como vegetales y frutas frescos y cereales enteros. Beba gran cantidad de lquido para mantener la orina de tono claro o amarillo plido.  Tome baos de agua tibia para calmar el dolor o las molestias causadas por las hemorroides. Use una crema para las hemorroides si el mdico la autoriza.  Si tiene venas varicosas, use medias de soporte. Eleve los pies durante 15 minutos,  3 4 veces por da. Limite el consumo de sal en su dieta.  Evite levantar objetos pesados, use zapatos de tacones bajos y mantenga una buena postura.  Descanse con las piernas elevadas si tiene calambres o dolor de cintura.  Visite a su dentista si no lo ha hecho durante el embarazo. Use un cepillo de dientes blando para higienizarse los dientes y use suavemente el hilo dental.  Puede continuar su vida sexual excepto que el mdico le indique otra cosa.  No haga viajes largos excepto que sea absolutamente necesario y slo con la aprobacin de su mdico.  Tome clases prenatales para entender, practicar y hacer preguntas sobre el trabajo de parto y el alumbramiento.  Haga un ensayo sobre la partida al hospital.  Prepare el bolso que llevar al hospital.  Prepare la habitacin del beb.  Contine concurriendo a todas las visitas prenatales segn las indicaciones de su mdico. SOLICITE ATENCIN MDICA SI:   No est segura si est en trabajo de parto o ha roto la bolsa de aguas.  Tiene mareos.  Siente clicos leves, presin en la pelvis o dolor persistente en el abdomen.  Tiene nuseas o vmitos persistentes.  Observa una   secrecin vaginal con mal olor.  Siente dolor al ConocoPhillipsorinar. SOLICITE ATENCIN MDICA DE INMEDIATO SI:   Tiene fiebre.  Pierde lquido o sangre por la vagina.  Tiene sangrado o pequeas prdidas vaginales.  Siente dolor intenso o clicos en el abdomen.  Sube o baja de peso rpidamente.  Le falta el aire y le duele el pecho al respirar.  Sbitamente se le hincha el rostro, las manos, los tobillos, los pies o las piernas de [place]manera extrema.  No ha sentido los movimientos del beb durante Georgianne Fickuna hora.  Siente un dolor de cabeza intenso que no se alivia con medicamentos.  Su visin se modifica. Document Released: 02/27/2005 Document Revised: 01/20/2013 [facility] Patient Information 2014 [place]ExitCare, MarylandLLC. Etonogestrel implant Qu es este medicamento? El  ETONOGESTREL es un dispositivo anticonceptivo (control de la natalidad). Se utiliza para Neurosurgeonprevenir el embarazo. Se puede utilizar hasta 3 aos. Este medicamento puede ser utilizado para otros usos; si tiene alguna pregunta consulte con su proveedor de atencin mdica o con su farmacutico. MARCAS COMERCIALES DISPONIBLES: Implanon, Nexplanon  Qu le debo informar a mi profesional de la salud antes de tomar este medicamento? Necesita saber si usted presenta alguno de los siguientes problemas o situaciones: -sangrado vaginal anormal -enfermedad vascular o cogulos sanguneos -cncer de mama, cervical, heptico -depresin -diabetes -enfermedad de la vescula biliar -dolores de cabeza -enfermedad cardiaca o ataque cardiaco reciente -alta presin sangunea -alto nivel de colesterol -enfermedad renal -enfermedad heptica -convulsiones -fuma tabaco -una reaccin alrgica o inusual al etonogestrel, otras hormonas, anestsicos o antispticos, medicamentos, alimentos, colorantes o conservantes -si est embarazada o buscando quedar embarazada -si est amamantando a un beb Cmo debo utilizar este medicamento? Este dispositivo se inserta debajo de la piel en la cara interna de la parte superior del brazo por un profesional de Radiographer, therapeuticla salud. Hable con su pediatra para informarse acerca del uso de este medicamento en nios. Puede requerir atencin especial. Sobredosis: Pngase en contacto inmediatamente con un centro toxicolgico o una sala de urgencia si usted cree que haya tomado demasiado medicamento. ATENCIN: Reynolds AmericanEste medicamento es solo para usted. No comparta este medicamento con nadie. Qu sucede si me olvido de una dosis? No se aplica en este caso. Qu puede interactuar con este medicamento? No tome esta medicina con ninguno de los siguientes medicamentos: -amprenavir -bosentano -fosamprenavir  Esta medicina tambin puede interactuar con los siguientes medicamentos: -medicamentos barbitricos  para inducir el sueo o tratar convulsiones -ciertos medicamentos para las infecciones micticas tales como quetoconazol e itraconazol -griseofulvina -medicamentos para tratar convulsiones, tales como carbamazepina, felbamato, oxcarbazepina, fenitona, topiramato -modafinil -fenilbutazona -rifampicina -algunos medicamentos para tratar la infeccin por VIH tales como atazanavir, indinavir, lopinavir, nelfinavir, tipranavir, ritonavir -hierba de [address]San Juan Puede ser que esta lista no menciona todas las posibles interacciones. Informe a su profesional de Beazer Homesla salud de [employer] Inctodos los productos a base de hierbas, medicamentos de [place]venta libre o suplementos nutritivos que est tomando. Si usted fuma, consume bebidas alcohlicas o si utiliza drogas ilegales, indqueselo tambin a su profesional de Beazer Homesla salud. Algunas sustancias pueden interactuar con su medicamento. A qu debo estar atento al usar PPL Corporationeste medicamento? Este medicamento no protege contra la infeccin por el VIH (SIDA) o otras enfermedades de transmisin sexual. Usted debe sentir el implante al presionar con las yemas de los dedos sobre la piel donde se insert. Dgale a su mdico si no se siente el implante. Qu efectos secundarios puedo tener al Boston Scientificutilizar este medicamento? Efectos secundarios que debe informar a su mdico o a Producer, television/film/videosu profesional  de la salud tan pronto como sea posible: -reacciones alrgicas como erupcin cutnea, picazn o urticarias, hinchazn de la cara, labios o lengua -ndulos mamarios -cambios en la visin -confusin, dificultad para hablar o entender -orina de color oscura -humor deprimido -sensacin general de estar enfermo o sntomas gripales -heces claras -prdida del apetito, nuseas -dolor en la regin abdominal superior derecha -dolores de Advertising account executivecabeza severos -dolor, hinchazon o sensibilidad grave en el abdomen -falta de aliento, dolor en el pecho, hinchazn de la pierna -seales de Chartered loss adjusterun embarazo -entumecimiento o debilidad  repentina de la cara, brazo o pierna -dificultad para caminar, mareos, prdida de equilibrio o coordinacin -sangrado o flujo vaginal inusual -cansancio o debilidad inusual -color amarillento de los ojos o la piel  Efectos secundarios que, por lo general, no requieren Psychologist, prison and probation servicesatencin mdica (debe informarlos a su mdico o a Producer, television/film/videosu profesional de la salud si persisten o si son molestos): -acn -dolor de pecho -cambios de peso -tos -fiebre o escalofros -dolor de cabeza -sangrado menstrual irregular -picazn, ardo o flujo vaginal -dolor o dificultad para Geographical information systems officerorinar -dolor de garganta Puede ser que esta lista no menciona todos los posibles efectos secundarios. Comunquese a su mdico por asesoramiento mdico Hewlett-Packardsobre los efectos secundarios. Usted puede informar los efectos secundarios a la FDA por telfono al 1-800-FDA-1088. Dnde debo guardar mi medicina? Este medicamento se administra en hospitales o clnicas y no necesitar guardarlo en su domicilio. ATENCIN: Este folleto es un resumen. Puede ser que no cubra toda la posible informacin. Si usted tiene preguntas acerca de esta medicina, consulte con su mdico, su farmacutico o su profesional de Radiographer, therapeuticla salud.  2014, Elsevier/[employer]. (2011-12-26 18:26:08)

## 2013-11-16 NOTE — Progress Notes (Signed)
Reports intermittent pelvic pain/pressure and braxton hicks.

## 2013-11-30 ENCOUNTER — Ambulatory Visit (INDEPENDENT_AMBULATORY_CARE_PROVIDER_SITE_OTHER): Payer: No Typology Code available for payment source | Admitting: Obstetrics and Gynecology

## 2013-11-30 ENCOUNTER — Encounter: Payer: Self-pay | Admitting: Obstetrics and Gynecology

## 2013-11-30 VITALS — BP 112/77 | HR 100 | Temp 97.9°F | Wt 127.2 lb

## 2013-11-30 DIAGNOSIS — Z3483 Encounter for supervision of other normal pregnancy, third trimester: Secondary | ICD-10-CM

## 2013-11-30 DIAGNOSIS — Z3480 Encounter for supervision of other normal pregnancy, unspecified trimester: Secondary | ICD-10-CM

## 2013-11-30 DIAGNOSIS — Z3493 Encounter for supervision of normal pregnancy, unspecified, third trimester: Secondary | ICD-10-CM

## 2013-11-30 DIAGNOSIS — Z3481 Encounter for supervision of other normal pregnancy, first trimester: Secondary | ICD-10-CM

## 2013-11-30 LAB — POCT URINALYSIS DIP (DEVICE)
Bilirubin Urine: NEGATIVE
Glucose, UA: NEGATIVE mg/dL
Hgb urine dipstick: NEGATIVE
Ketones, ur: NEGATIVE mg/dL
NITRITE: NEGATIVE
PH: 7.5 (ref 5.0–8.0)
PROTEIN: NEGATIVE mg/dL
Specific Gravity, Urine: 1.02 (ref 1.005–1.030)
UROBILINOGEN UA: 0.2 mg/dL (ref 0.0–1.0)

## 2013-11-30 LAB — OB RESULTS CONSOLE GC/CHLAMYDIA
CHLAMYDIA, DNA PROBE: NEGATIVE
Gonorrhea: NEGATIVE

## 2013-11-30 LAB — OB RESULTS CONSOLE GBS: STREP GROUP B AG: NEGATIVE

## 2013-11-30 NOTE — Progress Notes (Signed)
Pt reports pain in upper abdomen on Friday for most of the day.

## 2013-11-30 NOTE — Addendum Note (Signed)
Addended by: Danae OrleansPOE, DEIRDRE C on: 11/30/2013 01:34 PM   Modules accepted: Orders

## 2013-11-30 NOTE — Progress Notes (Signed)
Cultures done. WP done d/t vaginal irritation, erythema.

## 2013-11-30 NOTE — Progress Notes (Signed)
B-H contractions. Signs of labor reviewed. Still undecided re contraception. EFW 5.5-6#.

## 2013-11-30 NOTE — Patient Instructions (Signed)
Contracciones de Braxton Hicks °(Braxton Hicks Contractions) °Durante el embarazo, pueden presentarse contracciones uterinas que no siempre indican que está en trabajo de parto.  °¿QUÉ SON LAS CONTRACCIONES DE BRAXTON HICKS?  °Las contracciones que se presentan antes del trabajo de parto se conocen como contracciones de Braxton Hicks o falso trabajo de parto. Hacia el final del embarazo (32 a 34 semanas), estas contracciones pueden aparecen con más frecuencia y volverse más intensas. No corresponden al trabajo de parto verdadero porque estas contracciones no producen el agrandamiento (la dilatación) y el afinamiento del cuello del útero. Algunas veces, es difícil distinguirlas del trabajo de parto verdadero porque en algunos casos pueden ser muy intensas, y las personas tienen diferentes niveles de tolerancia al dolor. No debe sentirse avergonzada si concurre al hospital con falso trabajo de parto. En ocasiones, la única forma de saber si el trabajo de parto es verdadero es que el médico determine si hay cambios en el cuello del útero. °Si no hay problemas prenatales u otras complicaciones de salud asociadas con el embarazo, no habrá inconvenientes si la envían a su casa con falso trabajo de parto y espera que comience el verdadero. °CÓMO DIFERENCIAR EL TRABAJO DE PARTO FALSO DEL VERDADERO °Falso trabajo de parto °· Las contracciones del falso trabajo de parto duran menos y no son tan intensas como las verdaderas. °· Generalmente son irregulares. °· A menudo, se sienten en la parte delantera de la parte baja del abdomen y en la ingle, °· y pueden desaparecer cuando camina o cambia de posición mientras está acostada. °· Las contracciones se vuelven más débiles y su duración es menor a medida que el tiempo transcurre. °· Por lo general, no se hacen progresivamente más intensas, regulares y cercanas entre sí como en el caso del trabajo de parto verdadero. °Verdadero trabajo de parto °· Las contracciones del verdadero  trabajo de parto duran de 30 a 70 segundos, son muy regulares y suelen volverse más intensas, y aumenta su frecuencia. °· No desaparecen cuando camina. °· La molestia generalmente se siente en la parte superior del útero y se extiende hacia la zona inferior del abdomen y hacia la cintura. °· El médico podrá examinarla para determinar si el trabajo de parto es verdadero. El examen mostrará si el cuello del útero se está dilatando y afinando. °LO QUE DEBE RECORDAR °· Continúe haciendo los ejercicios habituales y siga otras indicaciones que el médico le dé. °· Tome todos los medicamentos como le indicó el médico. °· Concurra a las visitas prenatales regulares. °· Coma y beba con moderación si cree que está en trabajo de parto. °· Si las contracciones de Braxton Hicks le provocan incomodidad: °¨ Cambie de posición: si está acostada o descansando, camine; si está caminando, descanse. °¨ Siéntese y descanse en una bañera con agua tibia. °¨ Beba 2 o 3 vasos de agua. La deshidratación puede provocar contracciones. °¨ Respire lenta y profundamente varias veces por hora. °¿CUÁNDO DEBO BUSCAR ASISTENCIA MÉDICA INMEDIATA? °Solicite atención médica de inmediato si: °· Las contracciones se intensifican, se hacen más regulares y cercanas entre sí. °· Tiene una pérdida de líquido por la vagina. °· Tiene fiebre. °· Elimina mucosidad manchada con sangre. °· Tiene una hemorragia vaginal abundante. °· Tiene dolor abdominal permanente. °· Tiene un dolor en la zona lumbar que nunca tuvo antes. °· Siente que la cabeza del bebé empuja hacia abajo y ejerce presión en la zona pélvica. °· El bebé no se mueve tanto como solía. °Document Released: 02/27/2005 Document Revised: 05/25/2013 °ExitCare® Patient   Information ©2015 ExitCare, LLC. This information is not intended to replace advice given to you by your health care provider. Make sure you discuss any questions you have with your health care provider. ° °

## 2013-12-01 LAB — GC/CHLAMYDIA PROBE AMP
CT Probe RNA: NEGATIVE
GC Probe RNA: NEGATIVE

## 2013-12-01 LAB — WET PREP, GENITAL
Clue Cells Wet Prep HPF POC: NONE SEEN
Trich, Wet Prep: NONE SEEN
WBC WET PREP: NONE SEEN
Yeast Wet Prep HPF POC: NONE SEEN

## 2013-12-03 LAB — CULTURE, BETA STREP (GROUP B ONLY)

## 2013-12-10 ENCOUNTER — Ambulatory Visit (INDEPENDENT_AMBULATORY_CARE_PROVIDER_SITE_OTHER): Payer: No Typology Code available for payment source | Admitting: Obstetrics and Gynecology

## 2013-12-10 VITALS — BP 111/75 | HR 81 | Wt 128.6 lb

## 2013-12-10 DIAGNOSIS — Z3480 Encounter for supervision of other normal pregnancy, unspecified trimester: Secondary | ICD-10-CM

## 2013-12-10 DIAGNOSIS — Z3493 Encounter for supervision of normal pregnancy, unspecified, third trimester: Secondary | ICD-10-CM

## 2013-12-10 LAB — POCT URINALYSIS DIP (DEVICE)
Bilirubin Urine: NEGATIVE
GLUCOSE, UA: NEGATIVE mg/dL
Hgb urine dipstick: NEGATIVE
Ketones, ur: NEGATIVE mg/dL
Leukocytes, UA: NEGATIVE
NITRITE: NEGATIVE
Protein, ur: NEGATIVE mg/dL
Specific Gravity, Urine: 1.015 (ref 1.005–1.030)
UROBILINOGEN UA: 0.2 mg/dL (ref 0.0–1.0)
pH: 6.5 (ref 5.0–8.0)

## 2013-12-10 NOTE — Progress Notes (Signed)
Dong well. Pressure but no painful UCs. Labor precautions.

## 2013-12-10 NOTE — Patient Instructions (Signed)
Tercer trimestre de embarazo (Third Trimester of Pregnancy) El tercer trimestre va desde la semana29 hasta la 42, desde el sptimo hasta el noveno mes, y es la poca en la que el feto crece ms rpidamente. Hacia el final del noveno mes, el feto mide alrededor de 20pulgadas (45cm) de largo y pesa entre 6 y 10 libras (2,700 y 4,500kg).  CAMBIOS EN EL ORGANISMO Su organismo atraviesa por muchos cambios durante el embarazo, y estos varan de una mujer a otra.   Seguir aumentando de peso. Es de esperar que aumente entre 25 y 35libras (11 y 16kg) hacia el final del embarazo.  Podrn aparecer las primeras estras en las caderas, el abdomen y las mamas.  Puede tener necesidad de orinar con ms frecuencia porque el feto baja hacia la pelvis y ejerce presin sobre la vejiga.  Debido al embarazo podr sentir acidez estomacal con frecuencia.  Puede estar estreida, ya que ciertas hormonas enlentecen los movimientos de los msculos que empujan los desechos a travs de los intestinos.  Pueden aparecer hemorroides o abultarse e hincharse las venas (venas varicosas).  Puede sentir dolor plvico debido al aumento de peso y a que las hormonas del embarazo relajan las articulaciones entre los huesos de la pelvis. El dolor de espalda puede ser consecuencia de la sobrecarga de los msculos que soportan la postura.  Tal vez haya cambios en el cabello que pueden incluir su engrosamiento, crecimiento rpido y cambios en la textura. Adems, a algunas mujeres se les cae el cabello durante o despus del embarazo, o tienen el cabello seco o fino. Lo ms probable es que el cabello se le normalice despus del nacimiento del beb.  Las mamas seguirn creciendo y le dolern. A veces, puede haber una secrecin amarilla de las mamas llamada calostro.  El ombligo puede salir hacia afuera.  Puede sentir que le falta el aire debido a que se expande el tero.  Puede notar que el feto "baja" o lo siente ms bajo, en el  abdomen.  Puede tener una prdida de secrecin mucosa con sangre. Esto suele ocurrir en el trmino de unos pocos das a una semana antes de que comience el trabajo de parto.  El cuello del tero se vuelve delgado y blando (se borra) cerca de la fecha de parto. QU DEBE ESPERAR EN LOS EXMENES PRENATALES  Le harn exmenes prenatales cada 2semanas hasta la semana36. A partir de ese momento le harn exmenes semanales. Durante una visita prenatal de rutina:  La pesarn para asegurarse de que usted y el feto estn creciendo normalmente.  Le tomarn la presin arterial.  Le medirn el abdomen para controlar el desarrollo del beb.  Se escucharn los latidos cardacos fetales.  Se evaluarn los resultados de los estudios solicitados en visitas anteriores.  Le revisarn el cuello del tero cuando est prxima la fecha de parto para controlar si este se ha borrado. Alrededor de la semana36, el mdico le revisar el cuello del tero. Al mismo tiempo, realizar un anlisis de las secreciones del tejido vaginal. Este examen es para determinar si hay un tipo de bacteria, estreptococo Grupo B. El mdico le explicar esto con ms detalle. El mdico puede preguntarle lo siguiente:  Cmo le gustara que fuera el parto.  Cmo se siente.  Si siente los movimientos del beb.  Si ha tenido sntomas anormales, como prdida de lquido, sangrado, dolores de cabeza intensos o clicos abdominales.  Si tiene alguna pregunta. Otros exmenes o estudios de deteccin que pueden realizarse   durante el tercer trimestre incluyen lo siguiente:  Anlisis de sangre para controlar las concentraciones de hierro (anemia).  Controles fetales para determinar su salud, nivel de actividad y crecimiento. Si tiene alguna enfermedad o hay problemas durante el embarazo, le harn estudios. FALSO TRABAJO DE PARTO Es posible que sienta contracciones leves e irregulares que finalmente desaparecen. Se llaman contracciones de  Braxton Hicks o falso trabajo de parto. Las contracciones pueden durar horas, das o incluso semanas, antes de que el verdadero trabajo de parto se inicie. Si las contracciones ocurren a intervalos regulares, se intensifican o se hacen dolorosas, lo mejor es que la revise el mdico.  SIGNOS DE TRABAJO DE PARTO   Clicos de tipo menstrual.  Contracciones cada 5minutos o menos.  Contracciones que comienzan en la parte superior del tero y se extienden hacia abajo, a la zona inferior del abdomen y la espalda.  Sensacin de mayor presin en la pelvis o dolor de espalda.  Una secrecin de mucosidad acuosa o con sangre que sale de la vagina. Si tiene alguno de estos signos antes de la semana37 del embarazo, llame a su mdico de inmediato. Debe concurrir al hospital para que la controlen inmediatamente. INSTRUCCIONES PARA EL CUIDADO EN EL HOGAR   Evite fumar, consumir hierbas, beber alcohol y tomar frmacos que no le hayan recetado. Estas sustancias qumicas afectan la formacin y el desarrollo del beb.  Siga las indicaciones del mdico en relacin con el uso de medicamentos. Durante el embarazo, hay medicamentos que son seguros de tomar y otros que no.  Haga actividad fsica solo en la forma indicada por el mdico. Sentir clicos uterinos es un buen signo para detener la actividad fsica.  Contine comiendo alimentos que sanos con regularidad.  Use un sostn que le brinde buen soporte si le duelen las mamas.  No se d baos de inmersin en agua caliente, baos turcos ni saunas.  Colquese el cinturn de seguridad cuando conduzca.  No coma carne cruda ni queso sin cocinar; evite el contacto con las bandejas sanitarias de los gatos y la tierra que estos animales usan. Estos elementos contienen grmenes que pueden causar defectos congnitos en el beb.  Tome las vitaminas prenatales.  Si est estreida, pruebe un laxante suave (si el mdico lo autoriza). Consuma ms alimentos ricos en  fibra, como vegetales y frutas frescos y cereales integrales. Beba gran cantidad de lquido para mantener la orina de tono claro o color amarillo plido.  Dese baos de asiento con agua tibia para aliviar el dolor o las molestias causadas por las hemorroides. Use una crema para las hemorroides si el mdico la autoriza.  Si tiene venas varicosas, use medias de descanso. Eleve los pies durante 15minutos, 3 o 4veces por da. Limite la cantidad de sal en su dieta.  Evite levantar objetos pesados, use zapatos de tacones bajos y mantenga una buena postura.  Descanse con las piernas elevadas si tiene calambres o dolor de cintura.  Visite a su dentista si no lo ha hecho durante el embarazo. Use un cepillo de dientes blando para higienizarse los dientes y psese el hilo dental con suavidad.  Puede seguir manteniendo relaciones sexuales, a menos que el mdico le indique lo contrario.  No haga viajes largos excepto que sea absolutamente necesario y solo con la autorizacin del mdico.  Tome clases prenatales para entender, practicar y hacer preguntas sobre el trabajo de parto y el parto.  Haga un ensayo de la partida al hospital.  Prepare el bolso que   llevar al hospital.  Prepare la habitacin del beb.  Concurra a todas las visitas prenatales segn las indicaciones de su mdico. SOLICITE ATENCIN MDICA SI:  No est segura de que est en trabajo de parto o de que ha roto la bolsa de las aguas.  Tiene mareos.  Siente clicos leves, presin en la pelvis o dolor persistente en el abdomen.  Tiene nuseas, vmitos o diarrea persistentes.  Tiene secrecin vaginal con mal olor.  Siente dolor al orinar. SOLICITE ATENCIN MDICA DE INMEDIATO SI:   Tiene fiebre.  Tiene una prdida de lquido por la vagina.  Tiene sangrado o pequeas prdidas vaginales.  Siente dolor intenso o clicos en el abdomen.  Sube o baja de peso rpidamente.  Tiene dificultad para respirar y siente dolor de  pecho.  Sbitamente se le hinchan mucho el rostro, las manos, los tobillos, los pies o las piernas.  No ha sentido los movimientos del beb durante una hora.  Siente un dolor de cabeza intenso que no se alivia con medicamentos.  Hay cambios en la visin. Document Released: 02/27/2005 Document Revised: 05/25/2013 [employer] Patient Information 2015 [employer]. This information is not intended to replace advice given to you by your health care provider. Make sure you discuss any questions you have with your health care provider.  

## 2013-12-17 ENCOUNTER — Ambulatory Visit (INDEPENDENT_AMBULATORY_CARE_PROVIDER_SITE_OTHER): Payer: No Typology Code available for payment source | Admitting: Family Medicine

## 2013-12-17 VITALS — BP 109/81 | HR 87 | Temp 97.7°F | Wt 130.2 lb

## 2013-12-17 DIAGNOSIS — Z3483 Encounter for supervision of other normal pregnancy, third trimester: Secondary | ICD-10-CM

## 2013-12-17 DIAGNOSIS — Z3480 Encounter for supervision of other normal pregnancy, unspecified trimester: Secondary | ICD-10-CM

## 2013-12-17 LAB — POCT URINALYSIS DIP (DEVICE)
Bilirubin Urine: NEGATIVE
Glucose, UA: NEGATIVE mg/dL
Hgb urine dipstick: NEGATIVE
Ketones, ur: NEGATIVE mg/dL
LEUKOCYTES UA: NEGATIVE
Nitrite: NEGATIVE
PH: 6 (ref 5.0–8.0)
PROTEIN: NEGATIVE mg/dL
Specific Gravity, Urine: 1.015 (ref 1.005–1.030)
Urobilinogen, UA: 0.2 mg/dL (ref 0.0–1.0)

## 2013-12-17 NOTE — Patient Instructions (Signed)
Third Trimester of Pregnancy The third trimester is from week 29 through week 42, months 7 through 9. The third trimester is a time when the fetus is growing rapidly. At the end of the ninth month, the fetus is about 20 inches in length and weighs 6-10 pounds.  BODY CHANGES Your body goes through many changes during pregnancy. The changes vary from woman to woman.   Your weight will continue to increase. You can expect to gain 25-35 pounds (11-16 kg) by the end of the pregnancy.  You may begin to get stretch marks on your hips, abdomen, and breasts.  You may urinate more often because the fetus is moving lower into your pelvis and pressing on your bladder.  You may develop or continue to have heartburn as a result of your pregnancy.  You may develop constipation because certain hormones are causing the muscles that push waste through your intestines to slow down.  You may develop hemorrhoids or swollen, bulging veins (varicose veins).  You may have pelvic pain because of the weight gain and pregnancy hormones relaxing your joints between the bones in your pelvis. Backaches may result from overexertion of the muscles supporting your posture.  You may have changes in your hair. These can include thickening of your hair, rapid growth, and changes in texture. Some women also have hair loss during or after pregnancy, or hair that feels dry or thin. Your hair will most likely return to normal after your baby is born.  Your breasts will continue to grow and be tender. A yellow discharge may leak from your breasts called colostrum.  Your belly button may stick out.  You may feel short of breath because of your expanding uterus.  You may notice the fetus "dropping," or moving lower in your abdomen.  You may have a bloody mucus discharge. This usually occurs a few days to a week before labor begins.  Your cervix becomes thin and soft (effaced) near your due date. WHAT TO EXPECT AT YOUR PRENATAL  EXAMS  You will have prenatal exams every 2 weeks until week 36. Then, you will have weekly prenatal exams. During a routine prenatal visit:  You will be weighed to make sure you and the fetus are growing normally.  Your blood pressure is taken.  Your abdomen will be measured to track your baby's growth.  The fetal heartbeat will be listened to.  Any test results from the previous visit will be discussed.  You may have a cervical check near your due date to see if you have effaced. At around 36 weeks, your caregiver will check your cervix. At the same time, your caregiver will also perform a test on the secretions of the vaginal tissue. This test is to determine if a type of bacteria, Group B streptococcus, is present. Your caregiver will explain this further. Your caregiver may ask you:  What your birth plan is.  How you are feeling.  If you are feeling the baby move.  If you have had any abnormal symptoms, such as leaking fluid, bleeding, severe headaches, or abdominal cramping.  If you have any questions. Other tests or screenings that may be performed during your third trimester include:  Blood tests that check for low iron levels (anemia).  Fetal testing to check the health, activity level, and growth of the fetus. Testing is done if you have certain medical conditions or if there are problems during the pregnancy. FALSE LABOR You may feel small, irregular contractions that   eventually go away. These are called Braxton Hicks contractions, or false labor. Contractions may last for hours, days, or even weeks before true labor sets in. If contractions come at regular intervals, intensify, or become painful, it is best to be seen by your caregiver.  SIGNS OF LABOR   Menstrual-like cramps.  Contractions that are 5 minutes apart or less.  Contractions that start on the top of the uterus and spread down to the lower abdomen and back.  A sense of increased pelvic pressure or back  pain.  A watery or bloody mucus discharge that comes from the vagina. If you have any of these signs before the 37th week of pregnancy, call your caregiver right away. You need to go to the hospital to get checked immediately. HOME CARE INSTRUCTIONS   Avoid all smoking, herbs, alcohol, and unprescribed drugs. These chemicals affect the formation and growth of the baby.  Follow your caregiver's instructions regarding medicine use. There are medicines that are either safe or unsafe to take during pregnancy.  Exercise only as directed by your caregiver. Experiencing uterine cramps is a good sign to stop exercising.  Continue to eat regular, healthy meals.  Wear a good support bra for breast tenderness.  Do not use hot tubs, steam rooms, or saunas.  Wear your seat belt at all times when driving.  Avoid raw meat, uncooked cheese, cat litter boxes, and soil used by cats. These carry germs that can cause birth defects in the baby.  Take your prenatal vitamins.  Try taking a stool softener (if your caregiver approves) if you develop constipation. Eat more high-fiber foods, such as fresh vegetables or fruit and whole grains. Drink plenty of fluids to keep your urine clear or pale yellow.  Take warm sitz baths to soothe any pain or discomfort caused by hemorrhoids. Use hemorrhoid cream if your caregiver approves.  If you develop varicose veins, wear support hose. Elevate your feet for 15 minutes, 3-4 times a day. Limit salt in your diet.  Avoid heavy lifting, wear low heal shoes, and practice good posture.  Rest a lot with your legs elevated if you have leg cramps or low back pain.  Visit your dentist if you have not gone during your pregnancy. Use a soft toothbrush to brush your teeth and be gentle when you floss.  A sexual relationship may be continued unless your caregiver directs you otherwise.  Do not travel far distances unless it is absolutely necessary and only with the approval  of your caregiver.  Take prenatal classes to understand, practice, and ask questions about the labor and delivery.  Make a trial run to the hospital.  Pack your hospital bag.  Prepare the baby's nursery.  Continue to go to all your prenatal visits as directed by your caregiver. SEEK MEDICAL CARE IF:  You are unsure if you are in labor or if your water has broken.  You have dizziness.  You have mild pelvic cramps, pelvic pressure, or nagging pain in your abdominal area.  You have persistent nausea, vomiting, or diarrhea.  You have a bad smelling vaginal discharge.  You have pain with urination. SEEK IMMEDIATE MEDICAL CARE IF:   You have a fever.  You are leaking fluid from your vagina.  You have spotting or bleeding from your vagina.  You have severe abdominal cramping or pain.  You have rapid weight loss or gain.  You have shortness of breath with chest pain.  You notice sudden or extreme swelling   of your face, hands, ankles, feet, or legs.  You have not felt your baby move in over an hour.  You have severe headaches that do not go away with medicine.  You have vision changes. Document Released: 05/14/2001 Document Revised: 05/25/2013 Document Reviewed: 07/21/2012 [employer] Patient Information 2015 [employer]. This information is not intended to replace advice given to you by your health care provider. Make sure you discuss any questions you have with your health care provider.  

## 2013-12-17 NOTE — Progress Notes (Signed)
Pressure- lower back

## 2013-12-17 NOTE — Progress Notes (Signed)
S: 28 yo G2P1001 @ 1997w3d here for ROBV - doing well.  Still having pelvic pressure.  - some irregular contractions and back pain  - no lof, vb. +FM  O: see flowsheet  A/P - doing well - labor precautions discussed - f/u in 1 weeks.

## 2013-12-22 ENCOUNTER — Encounter: Payer: Self-pay | Admitting: Obstetrics & Gynecology

## 2013-12-22 ENCOUNTER — Ambulatory Visit (INDEPENDENT_AMBULATORY_CARE_PROVIDER_SITE_OTHER): Payer: No Typology Code available for payment source | Admitting: Obstetrics & Gynecology

## 2013-12-22 VITALS — BP 113/79 | HR 79 | Temp 97.6°F | Wt 131.4 lb

## 2013-12-22 DIAGNOSIS — Z3483 Encounter for supervision of other normal pregnancy, third trimester: Secondary | ICD-10-CM

## 2013-12-22 DIAGNOSIS — Z3480 Encounter for supervision of other normal pregnancy, unspecified trimester: Secondary | ICD-10-CM

## 2013-12-22 LAB — POCT URINALYSIS DIP (DEVICE)
Bilirubin Urine: NEGATIVE
GLUCOSE, UA: NEGATIVE mg/dL
HGB URINE DIPSTICK: NEGATIVE
Ketones, ur: NEGATIVE mg/dL
Nitrite: NEGATIVE
PROTEIN: NEGATIVE mg/dL
Specific Gravity, Urine: 1.015 (ref 1.005–1.030)
UROBILINOGEN UA: 0.2 mg/dL (ref 0.0–1.0)
pH: 6.5 (ref 5.0–8.0)

## 2013-12-22 NOTE — Patient Instructions (Signed)
Tercer trimestre de embarazo (Third Trimester of Pregnancy) El tercer trimestre va desde la semana29 hasta la 42, desde el sptimo hasta el noveno mes, y es la poca en la que el feto crece ms rpidamente. Hacia el final del noveno mes, el feto mide alrededor de 20pulgadas (45cm) de largo y pesa entre 6 y 10 libras (2,700 y 4,500kg).  CAMBIOS EN EL ORGANISMO Su organismo atraviesa por muchos cambios durante el embarazo, y estos varan de una mujer a otra.   Seguir aumentando de peso. Es de esperar que aumente entre 25 y 35libras (11 y 16kg) hacia el final del embarazo.  Podrn aparecer las primeras estras en las caderas, el abdomen y las mamas.  Puede tener necesidad de orinar con ms frecuencia porque el feto baja hacia la pelvis y ejerce presin sobre la vejiga.  Debido al embarazo podr sentir acidez estomacal con frecuencia.  Puede estar estreida, ya que ciertas hormonas enlentecen los movimientos de los msculos que empujan los desechos a travs de los intestinos.  Pueden aparecer hemorroides o abultarse e hincharse las venas (venas varicosas).  Puede sentir dolor plvico debido al aumento de peso y a que las hormonas del embarazo relajan las articulaciones entre los huesos de la pelvis. El dolor de espalda puede ser consecuencia de la sobrecarga de los msculos que soportan la postura.  Tal vez haya cambios en el cabello que pueden incluir su engrosamiento, crecimiento rpido y cambios en la textura. Adems, a algunas mujeres se les cae el cabello durante o despus del embarazo, o tienen el cabello seco o fino. Lo ms probable es que el cabello se le normalice despus del nacimiento del beb.  Las mamas seguirn creciendo y le dolern. A veces, puede haber una secrecin amarilla de las mamas llamada calostro.  El ombligo puede salir hacia afuera.  Puede sentir que le falta el aire debido a que se expande el tero.  Puede notar que el feto "baja" o lo siente ms bajo, en el  abdomen.  Puede tener una prdida de secrecin mucosa con sangre. Esto suele ocurrir en el trmino de unos pocos das a una semana antes de que comience el trabajo de parto.  El cuello del tero se vuelve delgado y blando (se borra) cerca de la fecha de parto. QU DEBE ESPERAR EN LOS EXMENES PRENATALES  Le harn exmenes prenatales cada 2semanas hasta la semana36. A partir de ese momento le harn exmenes semanales. Durante una visita prenatal de rutina:  La pesarn para asegurarse de que usted y el feto estn creciendo normalmente.  Le tomarn la presin arterial.  Le medirn el abdomen para controlar el desarrollo del beb.  Se escucharn los latidos cardacos fetales.  Se evaluarn los resultados de los estudios solicitados en visitas anteriores.  Le revisarn el cuello del tero cuando est prxima la fecha de parto para controlar si este se ha borrado. Alrededor de la semana36, el mdico le revisar el cuello del tero. Al mismo tiempo, realizar un anlisis de las secreciones del tejido vaginal. Este examen es para determinar si hay un tipo de bacteria, estreptococo Grupo B. El mdico le explicar esto con ms detalle. El mdico puede preguntarle lo siguiente:  Cmo le gustara que fuera el parto.  Cmo se siente.  Si siente los movimientos del beb.  Si ha tenido sntomas anormales, como prdida de lquido, sangrado, dolores de cabeza intensos o clicos abdominales.  Si tiene alguna pregunta. Otros exmenes o estudios de deteccin que pueden realizarse   durante el tercer trimestre incluyen lo siguiente:  Anlisis de sangre para controlar las concentraciones de hierro (anemia).  Controles fetales para determinar su salud, nivel de actividad y crecimiento. Si tiene alguna enfermedad o hay problemas durante el embarazo, le harn estudios. FALSO TRABAJO DE PARTO Es posible que sienta contracciones leves e irregulares que finalmente desaparecen. Se llaman contracciones de  Braxton Hicks o falso trabajo de parto. Las contracciones pueden durar horas, das o incluso semanas, antes de que el verdadero trabajo de parto se inicie. Si las contracciones ocurren a intervalos regulares, se intensifican o se hacen dolorosas, lo mejor es que la revise el mdico.  SIGNOS DE TRABAJO DE PARTO   Clicos de tipo menstrual.  Contracciones cada 5minutos o menos.  Contracciones que comienzan en la parte superior del tero y se extienden hacia abajo, a la zona inferior del abdomen y la espalda.  Sensacin de mayor presin en la pelvis o dolor de espalda.  Una secrecin de mucosidad acuosa o con sangre que sale de la vagina. Si tiene alguno de estos signos antes de la semana37 del embarazo, llame a su mdico de inmediato. Debe concurrir al hospital para que la controlen inmediatamente. INSTRUCCIONES PARA EL CUIDADO EN EL HOGAR   Evite fumar, consumir hierbas, beber alcohol y tomar frmacos que no le hayan recetado. Estas sustancias qumicas afectan la formacin y el desarrollo del beb.  Siga las indicaciones del mdico en relacin con el uso de medicamentos. Durante el embarazo, hay medicamentos que son seguros de tomar y otros que no.  Haga actividad fsica solo en la forma indicada por el mdico. Sentir clicos uterinos es un buen signo para detener la actividad fsica.  Contine comiendo alimentos que sanos con regularidad.  Use un sostn que le brinde buen soporte si le duelen las mamas.  No se d baos de inmersin en agua caliente, baos turcos ni saunas.  Colquese el cinturn de seguridad cuando conduzca.  No coma carne cruda ni queso sin cocinar; evite el contacto con las bandejas sanitarias de los gatos y la tierra que estos animales usan. Estos elementos contienen grmenes que pueden causar defectos congnitos en el beb.  Tome las vitaminas prenatales.  Si est estreida, pruebe un laxante suave (si el mdico lo autoriza). Consuma ms alimentos ricos en  fibra, como vegetales y frutas frescos y cereales integrales. Beba gran cantidad de lquido para mantener la orina de tono claro o color amarillo plido.  Dese baos de asiento con agua tibia para aliviar el dolor o las molestias causadas por las hemorroides. Use una crema para las hemorroides si el mdico la autoriza.  Si tiene venas varicosas, use medias de descanso. Eleve los pies durante 15minutos, 3 o 4veces por da. Limite la cantidad de sal en su dieta.  Evite levantar objetos pesados, use zapatos de tacones bajos y mantenga una buena postura.  Descanse con las piernas elevadas si tiene calambres o dolor de cintura.  Visite a su dentista si no lo ha hecho durante el embarazo. Use un cepillo de dientes blando para higienizarse los dientes y psese el hilo dental con suavidad.  Puede seguir manteniendo relaciones sexuales, a menos que el mdico le indique lo contrario.  No haga viajes largos excepto que sea absolutamente necesario y solo con la autorizacin del mdico.  Tome clases prenatales para entender, practicar y hacer preguntas sobre el trabajo de parto y el parto.  Haga un ensayo de la partida al hospital.  Prepare el bolso que   llevar al hospital.  Prepare la habitacin del beb.  Concurra a todas las visitas prenatales segn las indicaciones de su mdico. SOLICITE ATENCIN MDICA SI:  No est segura de que est en trabajo de parto o de que ha roto la bolsa de las aguas.  Tiene mareos.  Siente clicos leves, presin en la pelvis o dolor persistente en el abdomen.  Tiene nuseas, vmitos o diarrea persistentes.  Tiene secrecin vaginal con mal olor.  Siente dolor al orinar. SOLICITE ATENCIN MDICA DE INMEDIATO SI:   Tiene fiebre.  Tiene una prdida de lquido por la vagina.  Tiene sangrado o pequeas prdidas vaginales.  Siente dolor intenso o clicos en el abdomen.  Sube o baja de peso rpidamente.  Tiene dificultad para respirar y siente dolor de  pecho.  Sbitamente se le hinchan mucho el rostro, las manos, los tobillos, los pies o las piernas.  No ha sentido los movimientos del beb durante una hora.  Siente un dolor de cabeza intenso que no se alivia con medicamentos.  Hay cambios en la visin. Document Released: 02/27/2005 Document Revised: 05/25/2013 [employer] Patient Information 2015 [employer]. This information is not intended to replace advice given to you by your health care provider. Make sure you discuss any questions you have with your health care provider.  

## 2013-12-22 NOTE — Progress Notes (Signed)
Reports intermittent pelvic pressure and some irregular contractions.

## 2013-12-22 NOTE — Progress Notes (Signed)
Pt with pelvic pressure but, no contractions. No VB or LOF Reviewed labor precautions C/o constipation- rec Miralax

## 2013-12-28 ENCOUNTER — Inpatient Hospital Stay (HOSPITAL_COMMUNITY)
Admission: AD | Admit: 2013-12-28 | Discharge: 2013-12-29 | DRG: 775 | Disposition: A | Discharge status: Home / Self Care | Payer: Medicaid Other | Source: Ambulatory Visit | Attending: Obstetrics and Gynecology | Admitting: Obstetrics and Gynecology

## 2013-12-28 ENCOUNTER — Encounter (HOSPITAL_COMMUNITY): Payer: Self-pay | Admitting: *Deleted

## 2013-12-28 ENCOUNTER — Encounter: Payer: Self-pay | Admitting: Obstetrics and Gynecology

## 2013-12-28 DIAGNOSIS — O479 False labor, unspecified: Secondary | ICD-10-CM | POA: Diagnosis present

## 2013-12-28 DIAGNOSIS — Z3490 Encounter for supervision of normal pregnancy, unspecified, unspecified trimester: Secondary | ICD-10-CM

## 2013-12-28 LAB — CBC
HCT: 37.3 % (ref 36.0–46.0)
Hemoglobin: 12.5 g/dL (ref 12.0–15.0)
MCH: 28.6 pg (ref 26.0–34.0)
MCHC: 33.5 g/dL (ref 30.0–36.0)
MCV: 85.4 fL (ref 78.0–100.0)
PLATELETS: 243 10*3/uL (ref 150–400)
RBC: 4.37 MIL/uL (ref 3.87–5.11)
RDW: 12.9 % (ref 11.5–15.5)
WBC: 10.9 10*3/uL — AB (ref 4.0–10.5)

## 2013-12-28 LAB — TYPE AND SCREEN
ABO/RH(D): A POS
ANTIBODY SCREEN: NEGATIVE

## 2013-12-28 LAB — RPR

## 2013-12-28 LAB — ABO/RH: ABO/RH(D): A POS

## 2013-12-28 MED ORDER — ONDANSETRON HCL 4 MG/2ML IJ SOLN: 4.0000 mg | INTRAMUSCULAR | Status: DC | PRN

## 2013-12-28 MED ORDER — IBUPROFEN 600 MG PO TABS
600.0000 mg | ORAL_TABLET | Freq: Four times a day (QID) | ORAL | Status: DC
  Administered 2013-12-28 – 2013-12-29 (×5): 600 mg via ORAL
  Filled 2013-12-28 (×5): qty 1

## 2013-12-28 MED ORDER — LIDOCAINE HCL (PF) 1 % IJ SOLN
30.0000 mL | INTRAMUSCULAR | Status: DC | PRN
  Administered 2013-12-28: 30 mL via SUBCUTANEOUS
  Filled 2013-12-28: qty 30

## 2013-12-28 MED ORDER — PRENATAL MULTIVITAMIN CH
1.0000 | ORAL_TABLET | Freq: Every day | ORAL | Status: DC
  Administered 2013-12-28: 1 via ORAL
  Filled 2013-12-28 (×2): qty 1

## 2013-12-28 MED ORDER — OXYCODONE-ACETAMINOPHEN 5-325 MG PO TABS
1.0000 | ORAL_TABLET | ORAL | Status: DC | PRN
  Administered 2013-12-28: 2 via ORAL
  Filled 2013-12-28: qty 2

## 2013-12-28 MED ORDER — ACETAMINOPHEN 325 MG PO TABS: 650.0000 mg | ORAL_TABLET | ORAL | Status: DC | PRN

## 2013-12-28 MED ORDER — ONDANSETRON HCL 4 MG/2ML IJ SOLN: 4.0000 mg | Freq: Four times a day (QID) | INTRAMUSCULAR | Status: DC | PRN

## 2013-12-28 MED ORDER — LACTATED RINGERS IV SOLN
INTRAVENOUS | Status: DC
  Administered 2013-12-28: 03:00:00 via INTRAVENOUS

## 2013-12-28 MED ORDER — WITCH HAZEL-GLYCERIN EX PADS: 1.0000 "application " | MEDICATED_PAD | CUTANEOUS | Status: DC | PRN

## 2013-12-28 MED ORDER — MISOPROSTOL 200 MCG PO TABS: 800.0000 ug | ORAL_TABLET | Freq: Once | ORAL | Status: DC

## 2013-12-28 MED ORDER — DIBUCAINE 1 % RE OINT: 1.0000 "application " | TOPICAL_OINTMENT | RECTAL | Status: DC | PRN

## 2013-12-28 MED ORDER — TETANUS-DIPHTH-ACELL PERTUSSIS 5-2.5-18.5 LF-MCG/0.5 IM SUSP: 0.5000 mL | Freq: Once | INTRAMUSCULAR | Status: DC

## 2013-12-28 MED ORDER — SENNOSIDES-DOCUSATE SODIUM 8.6-50 MG PO TABS
2.0000 | ORAL_TABLET | ORAL | Status: DC
  Administered 2013-12-28: 2 via ORAL
  Filled 2013-12-28: qty 2

## 2013-12-28 MED ORDER — ZOLPIDEM TARTRATE 5 MG PO TABS: 5.0000 mg | ORAL_TABLET | Freq: Every evening | ORAL | Status: DC | PRN

## 2013-12-28 MED ORDER — OXYCODONE-ACETAMINOPHEN 5-325 MG PO TABS
1.0000 | ORAL_TABLET | ORAL | Status: DC | PRN
  Administered 2013-12-28: 1 via ORAL
  Filled 2013-12-28: qty 1

## 2013-12-28 MED ORDER — ONDANSETRON HCL 4 MG PO TABS
4.0000 mg | ORAL_TABLET | ORAL | Status: DC | PRN
  Administered 2013-12-28: 4 mg via ORAL
  Filled 2013-12-28: qty 1

## 2013-12-28 MED ORDER — OXYTOCIN 40 UNITS IN LACTATED RINGERS INFUSION - SIMPLE MED: 62.5000 mL/h | INTRAVENOUS | Status: DC

## 2013-12-28 MED ORDER — CITRIC ACID-SODIUM CITRATE 334-500 MG/5ML PO SOLN: 30.0000 mL | ORAL | Status: DC | PRN

## 2013-12-28 MED ORDER — IBUPROFEN 600 MG PO TABS
600.0000 mg | ORAL_TABLET | Freq: Four times a day (QID) | ORAL | Status: DC | PRN
  Administered 2013-12-28: 600 mg via ORAL
  Filled 2013-12-28: qty 1

## 2013-12-28 MED ORDER — PRENATAL PLUS 27-1 MG PO TABS
1.0000 | ORAL_TABLET | Freq: Every day | ORAL | Status: DC
  Administered 2013-12-29: 1 via ORAL

## 2013-12-28 MED ORDER — MISOPROSTOL 200 MCG PO TABS
ORAL_TABLET | ORAL | Status: AC
  Filled 2013-12-28: qty 4

## 2013-12-28 MED ORDER — DIPHENHYDRAMINE HCL 25 MG PO CAPS
25.0000 mg | ORAL_CAPSULE | Freq: Four times a day (QID) | ORAL | Status: DC | PRN
Start: 2013-12-28 — End: 2013-12-29

## 2013-12-28 MED ORDER — LANOLIN HYDROUS EX OINT: TOPICAL_OINTMENT | CUTANEOUS | Status: DC | PRN

## 2013-12-28 MED ORDER — LACTATED RINGERS IV SOLN: 500.0000 mL | INTRAVENOUS | Status: DC | PRN

## 2013-12-28 MED ORDER — BENZOCAINE-MENTHOL 20-0.5 % EX AERO: 1.0000 "application " | INHALATION_SPRAY | CUTANEOUS | Status: DC | PRN

## 2013-12-28 MED ORDER — SIMETHICONE 80 MG PO CHEW: 80.0000 mg | CHEWABLE_TABLET | ORAL | Status: DC | PRN

## 2013-12-28 MED ORDER — OXYTOCIN BOLUS FROM INFUSION
500.0000 mL | INTRAVENOUS | Status: DC
  Administered 2013-12-28: 500 mL via INTRAVENOUS

## 2013-12-28 NOTE — Progress Notes (Signed)
Spanish in-person interpreter used for Admission teaching and assessment.

## 2013-12-28 NOTE — Progress Notes (Signed)
UR chart review completed.  

## 2013-12-28 NOTE — H&P (Signed)
LABOR ADMISSION HISTORY AND PHYSICAL  Dolores Kauri Garson is a 28 y.o. female G2P1001 with IUP at [redacted]w[redacted]d by LMP presenting for active term labor. Pt presents w/ contractions. She reports +FMs, No LOF, no VB, no blurry vision, headaches or peripheral edema, and RUQ pain. . She plans on breast and bottle feeding. She is undecided for birth control.    Dating: By L/19 --->  Estimated Date of Delivery: 12/28/13  Sono:    @[redacted]w[redacted]d , CWD, normal anatomy   Prenatal History/Complications:  Past Medical History: Past Medical History  Diagnosis Date  . Medical history non-contributory     Past Surgical History: Past Surgical History  Procedure Laterality Date  . No past surgeries      Obstetrical History: OB History   Grav Para Term Preterm Abortions TAB SAB Ect Mult Living   2 1 1  0 0 0 0 0 0 1       Gynecological History: OB History   Grav Para Term Preterm Abortions TAB SAB Ect Mult Living   2 1 1  0 0 0 0 0 0 1      Social History: History   Social History  . Marital Status: Single    Spouse Name: N/A    Number of Children: N/A  . Years of Education: N/A   Social History Main Topics  . Smoking status: Never Smoker   . Smokeless tobacco: Never Used  . Alcohol Use: No  . Drug Use: No  . Sexual Activity: Yes   Other Topics Concern  . None   Social History Narrative   ** Merged History Encounter **        Family History: History reviewed. No pertinent family history.  Allergies: No Known Allergies  Prescriptions prior to admission  Medication Sig Dispense Refill  . prenatal vitamin w/FE, FA (PRENATAL 1 + 1) 27-1 MG TABS tablet Take 1 tablet by mouth daily.  30 each  12     Review of Systems   All systems reviewed and negative except as stated in HPI  Blood pressure 138/85, pulse 73, temperature 98.1 F (36.7 C), temperature source Oral, resp. rate 18, last menstrual period 03/23/2013. General appearance: alert, cooperative and mild  distress Lungs: clear to auscultation bilaterally Heart: regular rate and rhythm Abdomen: soft, non-tender; bowel sounds normal Pelvic: adequate Extremities: Homans sign is negative, no sign of DVT Presentation: cephalic Fetal monitoringBaseline: 150 bpm, Variability: Good {> 6 bpm), Accelerations: Reactive and Decelerations: Absent Uterine activityFrequency: Every 2 minutes Dilation: 8.5 Effacement (%): 100 Station: 0 Exam by:: Emilio Math   Prenatal labs: ABO, Rh: A/POS/-- (12/11 1433) Antibody: NEG (12/11 1433) Rubella:   RPR: NON REAC (05/15 1122)  HBsAg: NEGATIVE (12/11 1433)  HIV: NONREACTIVE (05/15 1122)  GBS: Negative (06/30 0000)  1 hr Glucola 113 Genetic screening  declined Anatomy US WNL   Prenatal Transfer Tool  Maternal Diabetes: No Genetic Screening: Declined Maternal Ultrasounds/Referrals: Normal Fetal Ultrasounds or other Referrals:  None Maternal Substance Abuse:  No Significant Maternal Medications:  None Significant Maternal Lab Results: Lab values include: Group B Strep negative     No results found for this or any previous visit (from the past 24 hour(s)).  Patient Active Problem List   Diagnosis Date Noted  . Near syncope 10/15/2013  . UTI (urinary tract infection) in pregnancy in first trimester 06/30/2013  . Supervision of normal subsequent pregnancy 06/02/2013  . Pelvic pain in female 02/22/2013    Assessment: Kalya Troeger is a  28 y.o. G2P1001 at 4415w0d here for active labor at term.    #Labor: Expectant management #Pain: Given rapid progression, will hold on meds for now #FWB: Cat I, reassuring #ID:  GBS neg #MOF: Breast + Bottle #MOC: Undecided #Circ:  Declines  Elita BooneRoberts, Lether Tesch C 12/28/2013, 2:41 AM

## 2013-12-29 LAB — CBC
HCT: 34.3 % — ABNORMAL LOW (ref 36.0–46.0)
Hemoglobin: 11.3 g/dL — ABNORMAL LOW (ref 12.0–15.0)
MCH: 28.3 pg (ref 26.0–34.0)
MCHC: 32.9 g/dL (ref 30.0–36.0)
MCV: 86 fL (ref 78.0–100.0)
PLATELETS: 211 10*3/uL (ref 150–400)
RBC: 3.99 MIL/uL (ref 3.87–5.11)
RDW: 13.1 % (ref 11.5–15.5)
WBC: 13.8 10*3/uL — AB (ref 4.0–10.5)

## 2013-12-29 MED ORDER — IBUPROFEN 400 MG PO TABS: 400.0000 mg | ORAL_TABLET | Freq: Four times a day (QID) | ORAL | Status: DC | PRN

## 2013-12-29 MED ORDER — DOCUSATE SODIUM 100 MG PO CAPS: 100.0000 mg | ORAL_CAPSULE | Freq: Two times a day (BID) | ORAL | Status: DC

## 2013-12-29 NOTE — Discharge Instructions (Signed)

## 2013-12-29 NOTE — Lactation Note (Signed)
This note was copied from the chart of Diamond Baldwin. Lactation Consultation Note  Interpreter [name] present. Mother has baby in cradle position upon entering the room.  Provided a pillow to lift infant to breast level since infant was pulling her nipple down. Mother is tender.  Encouraged applying ebm to nipples for soreness. Reviewed engorgement care and Mom encouraged to feed baby 8-12 times/24 hours and with feeding cues.    Patient Name: Diamond Baldwin RUEAV'WToday's Date: 12/29/2013 Reason for consult: Follow-up assessment   Maternal Data    Feeding Feeding Type: Breast Fed  LATCH Score/Interventions Latch: Grasps breast easily, tongue down, lips flanged, rhythmical sucking.  Audible Swallowing: Spontaneous and intermittent  Type of Nipple: Everted at rest and after stimulation  Comfort (Breast/Nipple): Filling, red/small blisters or bruises, mild/mod discomfort  Problem noted: Mild/Moderate discomfort Interventions (Mild/moderate discomfort): Hand expression  Hold (Positioning): Assistance needed to correctly position infant at breast and maintain latch.  LATCH Score: 8  Lactation Tools Discussed/Used     Consult Status Consult Status: Complete    Hardie PulleyBerkelhammer, Geriann Lafont Boschen 12/29/2013, 9:08 AM

## 2013-12-29 NOTE — Discharge Summary (Signed)
Obstetric Discharge Summary Reason for Admission: onset of labor Prenatal Procedures: none Intrapartum Procedures: spontaneous vaginal delivery Postpartum Procedures: none Complications-Operative and Postpartum: 1st degree perineal laceration  Delivery Note At 3:02 AM a viable female was delivered via Vaginal, Spontaneous Delivery (Presentation: Left Occiput Anterior).  APGAR: 9, 10; weight 7 lb 5.2 oz (3323 g).   Placenta status: Intact, Spontaneous.  Cord: 3 vessels with the following complications: None.  Anesthesia: None  Episiotomy: None Lacerations: 1st degree;Labial Suture Repair: 3.0 vicryl Est. Blood Loss (mL): 350  Mom to postpartum.  Baby to Couplet care / Skin to Skin.   Hospital Course:  Active Problems:   Pregnancy   Today: No acute events overnight.  Pt denies problems with ambulating, voiding or po intake.  She denies nausea or vomiting.  Pain is well controlled.  She has had flatus. She has not had bowel movement.  Lochia Moderate.  Plan for birth control is  nexplanon.  Method of Feeding: breast and bottle  Diamond Baldwin is a 28 y.o. [id number] s/p SVD PPD#1.  Patient presented to [facility] in active labor.  She has postoperative course that was uncomplicated including no problems with ambulating, PO intake, urination, pain, or bleeding. The pt feels ready to go home and  will be discharged with outpatient follow-up.    H/H: Lab Results  Component Value Date/Time   HGB 11.3* 12/29/2013  6:05 AM   HCT 34.3* 12/29/2013  6:05 AM    Discharge Diagnoses: Term Pregnancy-delivered  Discharge Information: Date: @TODAY @ Activity: pelvic rest Diet: routine  Medications: Ibuprofen and Colace Breast feeding:  Yes Condition: stable Instructions: refer to handout Discharge to: home      Medication List    ASK your doctor about these medications       prenatal vitamin w/FE, FA 27-1 MG Tabs tablet  Take 1 tablet by mouth daily.         Diamond Baldwin  Diamond Baldwin ,MD OB Fellow 12/29/2013,9:32 AM

## 2013-12-29 NOTE — Discharge Summary (Signed)
Attestation of Attending Supervision of Obstetric Fellow: Evaluation and management procedures were performed by the Obstetric Fellow under my supervision and collaboration.  I have reviewed the Obstetric Fellow's note and chart, and I agree with the management and plan.  Teigan Sahli, MD, FACOG Attending Obstetrician & Gynecologist Faculty Practice, [facility]   

## 2014-01-15 IMAGING — US US TRANSVAGINAL NON-OB
1 series · 14 of 25 positions shown · non-contrast
Comparison: 09/14/2009

CLINICAL DATA: Pelvic pain

EXAM:
TRANSVAGINAL ULTRASOUND OF PELVIS
TECHNIQUE: Transvaginal ultrasound examination of the pelvis was performed
including evaluation of the uterus, ovaries, adnexal regions, and
pelvic cul-de-sac.

[Series 1: us pelvis complete · 14 of 70 slices shown]
[im 1/70]
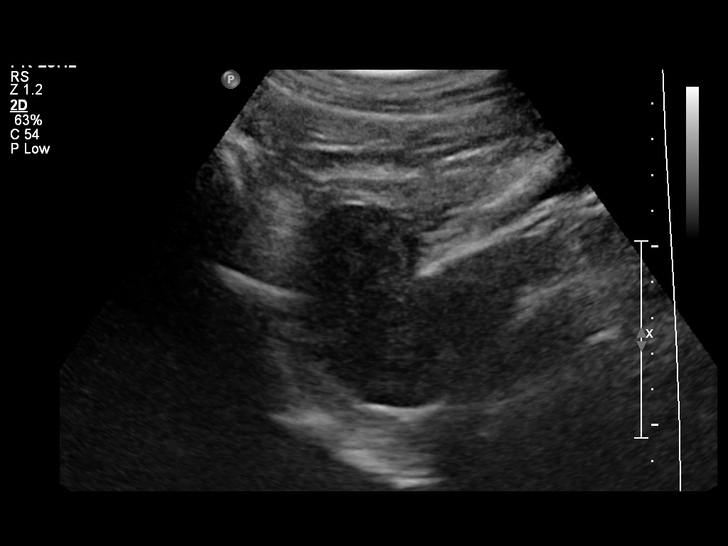
[im 6/70]
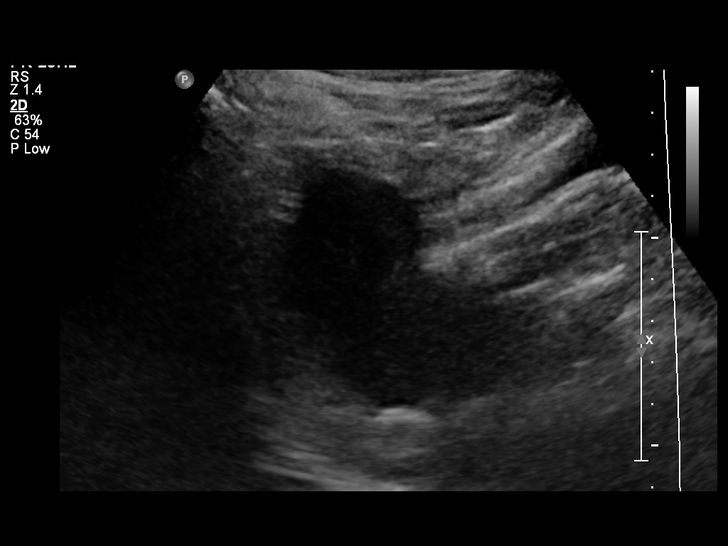
[im 12/70]
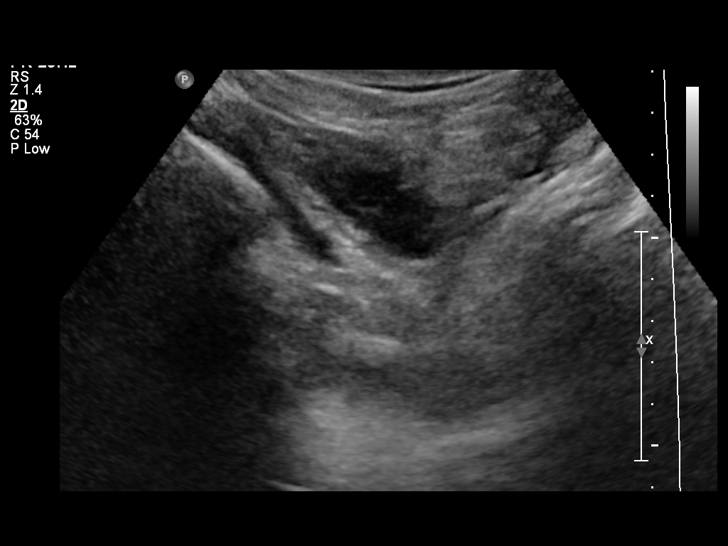
[im 18/70]
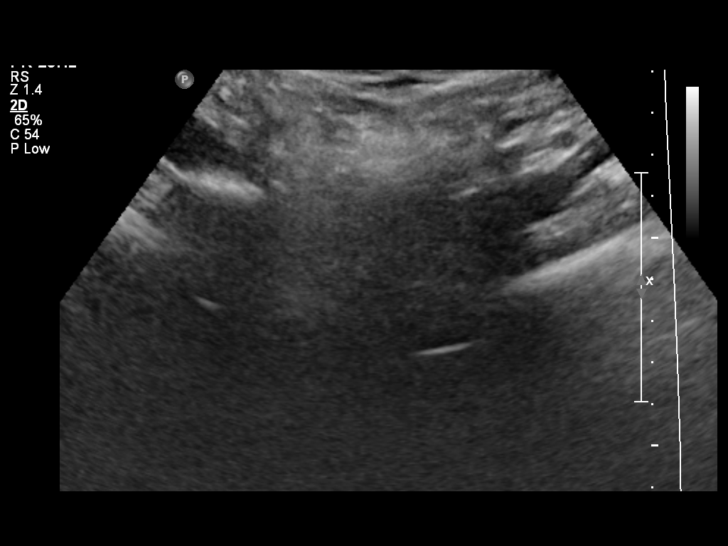
[im 24/70]
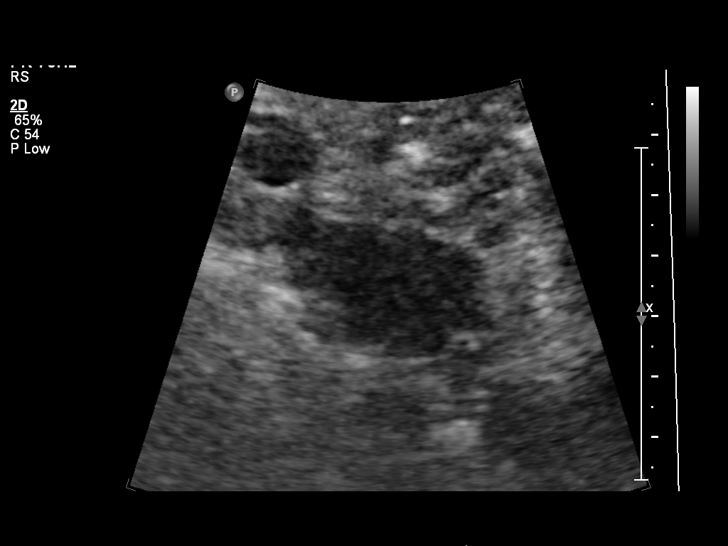
[im 26/70]
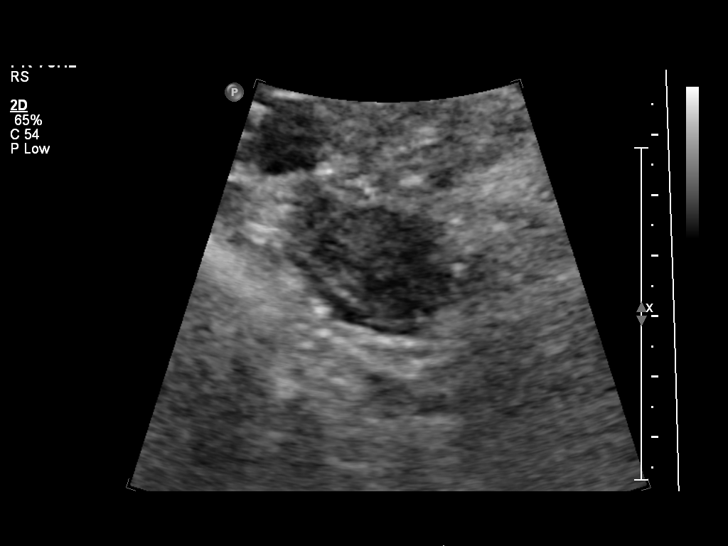
[im 32/70]
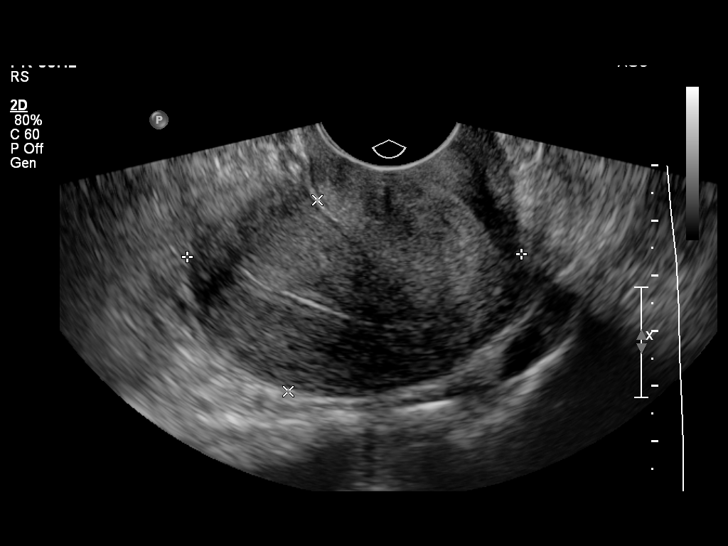
[im 38/70]
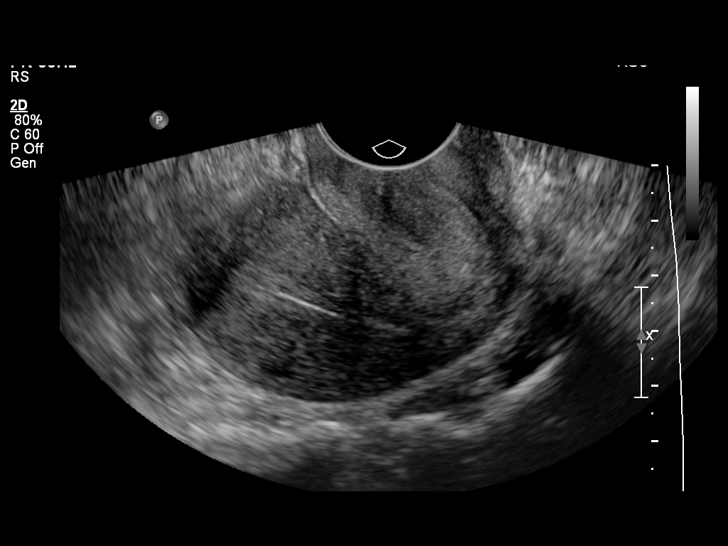
[im 44/70]
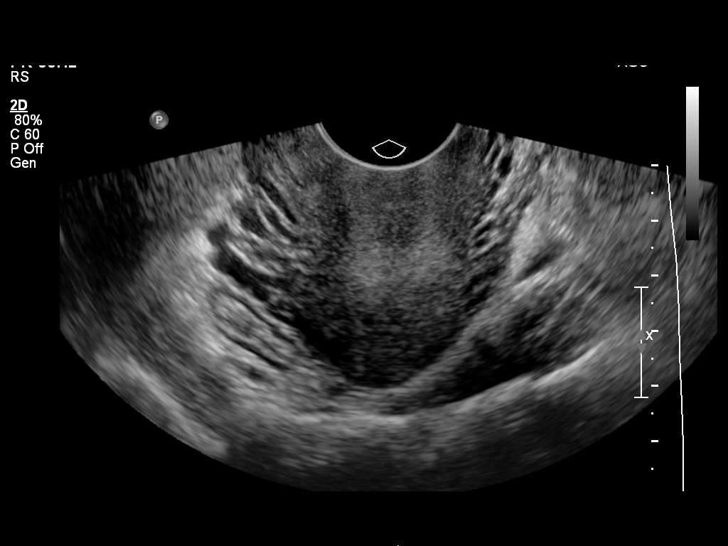
[im 47/70]
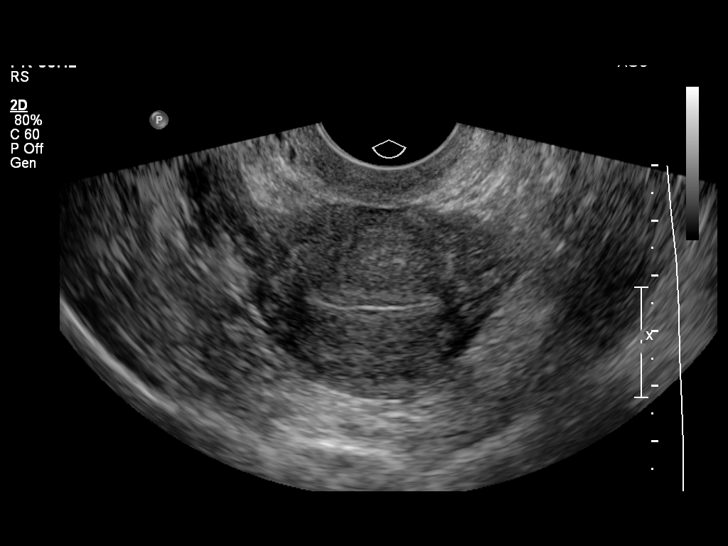
[im 52/70]
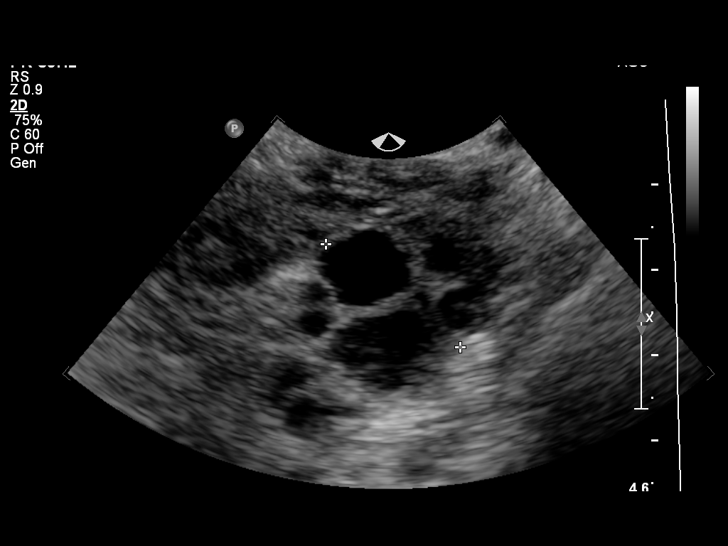
[im 58/70]
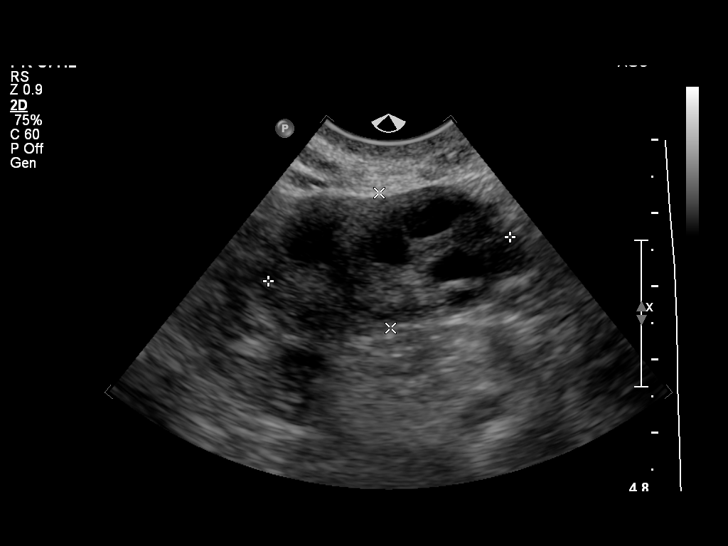
[im 64/70]
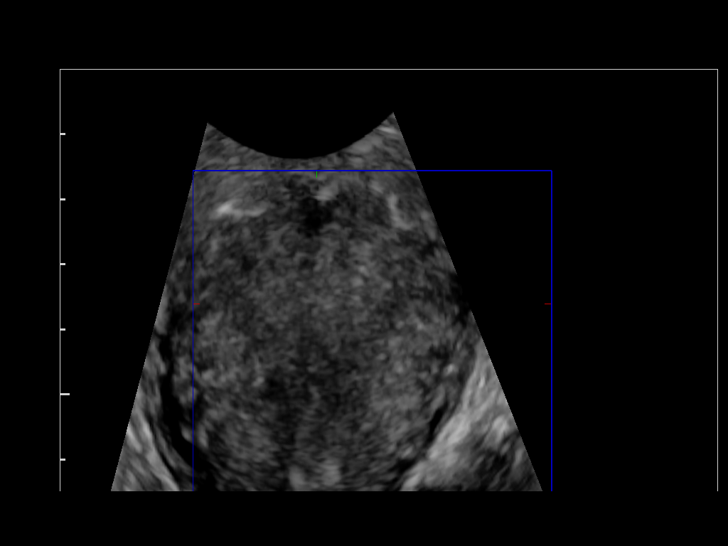
[im 70/70]
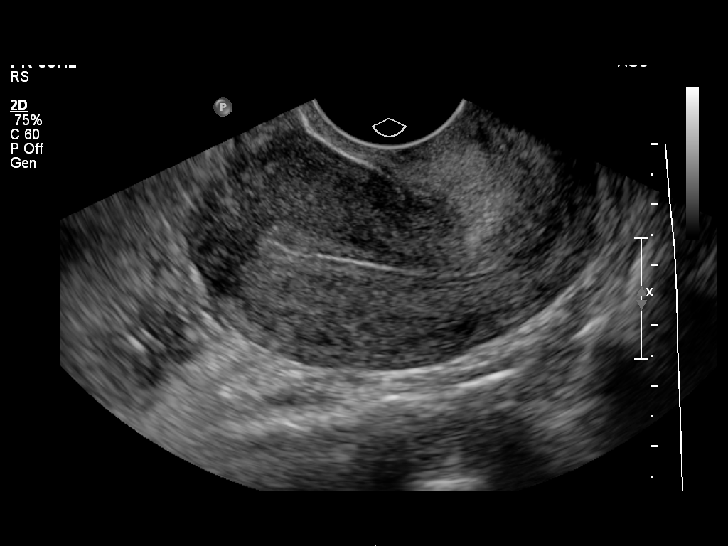

[14 of 25 positions shown; findings below may reference images not displayed]

FINDINGS: Uterus

Measurements: 6.1 x 3.6 x 4.6 cm. No fibroids or other mass
visualized.

Endometrium

Thickness: 6.6 mm.  No focal abnormality visualized.

Right ovary

Measurements: 3.4 x 1.9 x 2.6 cm. Normal appearance/no adnexal mass.

Left ovary

Measurements: 3.0 x 1.7 x 2.0 cm. Normal appearance/no adnexal mass.

Other findings:  No free fluid
IMPRESSION: Normal study. No evidence of pelvic mass or other significant
abnormality.

## 2014-02-14 ENCOUNTER — Ambulatory Visit (INDEPENDENT_AMBULATORY_CARE_PROVIDER_SITE_OTHER): Payer: Medicaid Other | Admitting: Nurse Practitioner

## 2014-02-14 ENCOUNTER — Encounter: Payer: Self-pay | Admitting: Nurse Practitioner

## 2014-02-14 VITALS — BP 111/68 | HR 75 | Temp 98.0°F | Wt 120.7 lb

## 2014-02-14 DIAGNOSIS — Z309 Encounter for contraceptive management, unspecified: Secondary | ICD-10-CM | POA: Insufficient documentation

## 2014-02-14 DIAGNOSIS — Z01812 Encounter for preprocedural laboratory examination: Secondary | ICD-10-CM

## 2014-02-14 LAB — POCT PREGNANCY, URINE: Preg Test, Ur: NEGATIVE

## 2014-02-14 MED ORDER — NORETHINDRONE 0.35 MG PO TABS: 1.0000 | ORAL_TABLET | Freq: Every day | ORAL | Status: DC

## 2014-02-14 NOTE — Patient Instructions (Signed)
Etonogestrel implant Qu es este medicamento? El ETONOGESTREL es un dispositivo anticonceptivo (control de la natalidad). Se utiliza para prevenir el embarazo. Se puede utilizar hasta 3 aos. Este medicamento puede ser utilizado para otros usos; si tiene alguna pregunta consulte con su proveedor de atencin mdica o con su farmacutico. MARCAS COMERCIALES DISPONIBLES: Implanon, Nexplanon Qu le debo informar a mi profesional de la salud antes de tomar este medicamento? Necesita saber si usted presenta alguno de los siguientes problemas o situaciones: -sangrado vaginal anormal -enfermedad vascular o cogulos sanguneos -cncer de mama, cervical, heptico -depresin -diabetes -enfermedad de la vescula biliar -dolores de cabeza -enfermedad cardiaca o ataque cardiaco reciente -alta presin sangunea -alto nivel de colesterol -enfermedad renal -enfermedad heptica -convulsiones -fuma tabaco -una reaccin alrgica o inusual al etonogestrel, otras hormonas, anestsicos o antispticos, medicamentos, alimentos, colorantes o conservantes -si est embarazada o buscando quedar embarazada -si est amamantando a un beb Cmo debo utilizar este medicamento? Este dispositivo se inserta debajo de la piel en la cara interna de la parte superior del brazo por un profesional de la salud. Hable con su pediatra para informarse acerca del uso de este medicamento en nios. Puede requerir atencin especial. Sobredosis: Pngase en contacto inmediatamente con un centro toxicolgico o una sala de urgencia si usted cree que haya tomado demasiado medicamento. ATENCIN: Este medicamento es solo para usted. No comparta este medicamento con nadie. Qu sucede si me olvido de una dosis? No se aplica en este caso. Qu puede interactuar con este medicamento? No tome esta medicina con ninguno de los siguientes medicamentos: -amprenavir -bosentano -fosamprenavir Esta medicina tambin puede interactuar con los  siguientes medicamentos: -medicamentos barbitricos para inducir el sueo o tratar convulsiones -ciertos medicamentos para las infecciones micticas tales como quetoconazol e itraconazol -griseofulvina -medicamentos para tratar convulsiones, tales como carbamazepina, felbamato, oxcarbazepina, fenitona, topiramato -modafinil -fenilbutazona -rifampicina -algunos medicamentos para tratar la infeccin por VIH tales como atazanavir, indinavir, lopinavir, nelfinavir, tipranavir, ritonavir -hierba de [place] Puede ser que esta lista no menciona todas las posibles interacciones. Informe a su profesional de la salud de todos los productos a base de hierbas, medicamentos de venta libre o suplementos nutritivos que est tomando. Si usted fuma, consume bebidas alcohlicas o si utiliza drogas ilegales, indqueselo tambin a su profesional de la salud. Algunas sustancias pueden interactuar con su medicamento. A qu debo estar atento al usar este medicamento? Este medicamento no protege contra la infeccin por el VIH (SIDA) o otras enfermedades de transmisin sexual. Usted debe sentir el implante al presionar con las yemas de los dedos sobre la piel donde se insert. Dgale a su mdico si no se siente el implante. Qu efectos secundarios puedo tener al utilizar este medicamento? Efectos secundarios que debe informar a su mdico o a su profesional de la salud tan pronto como sea posible: -reacciones alrgicas como erupcin cutnea, picazn o urticarias, hinchazn de la cara, labios o lengua -ndulos mamarios -cambios en la visin -confusin, dificultad para hablar o entender -orina de color oscura -humor deprimido -sensacin general de estar enfermo o sntomas gripales -heces claras -prdida del apetito, nuseas -dolor en la regin abdominal superior derecha -dolores de cabeza severos -dolor, hinchazon o sensibilidad grave en el abdomen -falta de aliento, dolor en el pecho, hinchazn de la  pierna -seales de un embarazo -entumecimiento o debilidad repentina de la cara, brazo o pierna -dificultad para caminar, mareos, prdida de equilibrio o coordinacin -sangrado o flujo vaginal inusual -cansancio o debilidad inusual -color amarillento de los ojos o la piel   Efectos secundarios que, por lo general, no requieren atencin mdica (debe informarlos a su mdico o a su profesional de la salud si persisten o si son molestos): -acn -dolor de pecho -cambios de peso -tos -fiebre o escalofros -dolor de cabeza -sangrado menstrual irregular -picazn, ardo o flujo vaginal -dolor o dificultad para orinar -dolor de garganta Puede ser que esta lista no menciona todos los posibles efectos secundarios. Comunquese a su mdico por asesoramiento mdico sobre los efectos secundarios. Usted puede informar los efectos secundarios a la FDA por telfono al 1-800-FDA-1088. Dnde debo guardar mi medicina? Este medicamento se administra en hospitales o clnicas y no necesitar guardarlo en su domicilio. ATENCIN: Este folleto es un resumen. Puede ser que no cubra toda la posible informacin. Si usted tiene preguntas acerca de esta medicina, consulte con su mdico, su farmacutico o su profesional de la salud.  2015, Elsevier/Gold Standard. (2011-12-26 18:26:08)  

## 2014-02-14 NOTE — Progress Notes (Signed)
Patient ID: Diamond Baldwin, female   DOB: 11/20/1985, 28 y.o.   MRN: [id number] Subjective:     Diamond Baldwin is a 28 y.o. female who presents for a postpartum visit. She is 6 week postpartum following a spontaneous vaginal delivery. I have fully reviewed the prenatal and intrapartum course. The delivery was at [redacted] weeks gestational weeks. Outcome: spontaneous vaginal delivery. Anesthesia: none. Postpartum course has been uneventful. Baby's course has been uneventful. Baby is feeding by breast. Bleeding no bleeding. Bowel function is normal. Bladder function is normal. Patient is not sexually active. Contraception method is Nexplanon. Postpartum depression screening: negative.  The following portions of the patient's history were reviewed and updated as appropriate: current medications, past family history, past medical history, past social history, past surgical history and problem list.  Review of Systems Pertinent items are noted in HPI.   Objective:    BP 111/68  Pulse 75  Temp(Src) 98 F (36.7 C) (Oral)  Wt 120 lb 11.2 oz (54.749 kg)  General:  alert   Breasts:  inspection negative, no nipple discharge or bleeding, no masses or nodularity palpable  Lungs: clear to auscultation bilaterally  Heart:  regular rate and rhythm, S1, S2 normal, no murmur, click, rub or gallop  Abdomen: soft, non-tender; bowel sounds normal; no masses,  no organomegaly   Vulva:  normal  Vagina: small amount yellow discharge  Cervix:  no lesions  Corpus: normal size, contour, position, consistency, mobility, non-tender  Adnexa:  normal adnexa  Rectal Exam: Normal rectovaginal exam        Assessment:    postpartum exam. Pap smear not done at today's visit.   Plan:    1. Contraception: Nexplanon / [facility]      Until then [employer] BCPs one PO QD # 6 packs 2. Postpartum Exam 3. Follow up in: 1 Year or as needed.

## 2014-02-14 NOTE — Addendum Note (Signed)
Addended by: Aldona Lento on: 02/14/2014 04:23 PM   Modules accepted: Orders

## 2014-02-14 NOTE — Progress Notes (Signed)
Interpreter Mattie Marlin used in this encounter. Patient here for PP. Wishes to have nexplanon--uninsured-- will go to health department, however, wishes to have OCP in the meantime. Denies any sexual intercourse since delivery. UPT obtained.

## 2014-02-15 ENCOUNTER — Telehealth: Payer: Self-pay

## 2014-02-15 DIAGNOSIS — N760 Acute vaginitis: Principal | ICD-10-CM

## 2014-02-15 DIAGNOSIS — B9689 Other specified bacterial agents as the cause of diseases classified elsewhere: Secondary | ICD-10-CM

## 2014-02-15 LAB — WET PREP, GENITAL
TRICH WET PREP: NONE SEEN
Yeast Wet Prep HPF POC: NONE SEEN

## 2014-02-15 MED ORDER — METRONIDAZOLE 500 MG PO TABS: 500.0000 mg | ORAL_TABLET | Freq: Two times a day (BID) | ORAL | Status: DC

## 2014-02-15 NOTE — Telephone Encounter (Signed)
[employer] interpreter ID# [id number] Derek Mound) used in this encounter. No answer. Left message stating we are calling with results and information of a RX that has been sent to your pharmacy, please call clinic.

## 2014-02-15 NOTE — Telephone Encounter (Signed)
Message copied by Louanna Raw on Tue Feb 15, 2014 11:01 AM ------      Message from: BAREFOOT, [place] M      Created: Tue Feb 15, 2014 10:16 AM       Can you let her know she has bacterial vaginosis and needs Flagyl 500 mg PO bid x 7 days. You will need the interpreter. Thank you, Bonita Quin  ------

## 2014-02-16 ENCOUNTER — Telehealth: Payer: Self-pay

## 2014-02-16 DIAGNOSIS — Z309 Encounter for contraceptive management, unspecified: Secondary | ICD-10-CM

## 2014-02-16 MED ORDER — METRONIDAZOLE 500 MG PO TABS: 500.0000 mg | ORAL_TABLET | Freq: Two times a day (BID) | ORAL | Status: DC

## 2014-02-16 NOTE — Telephone Encounter (Signed)
Called patient with Diamond Baldwin for interpreter and informed patient of results and medication available for pickup at her [facility] pharmacy. Patient asked pharmacy be changed to [facility]. Medication sent there. Patient verbalized understanding to all and had no other questions

## 2014-02-16 NOTE — Telephone Encounter (Signed)
Patient called requesting a call back. Did not specify reason for call.

## 2014-02-17 MED ORDER — NORETHINDRONE 0.35 MG PO TABS: 1.0000 | ORAL_TABLET | Freq: Every day | ORAL | Status: DC

## 2014-02-17 NOTE — Telephone Encounter (Signed)
Called patient with interpreter [name]. Patient requested that birth control pills be sent to [facility] on [address]. Per chart review, originally prescribed to [facility] on [address]. Re-prescribed medication to [facility] on [address]. Informed patient that in the future she can go to the [facility] of her choice and inform them there is a RX that she would like to obtain and they can pull it to their pharmacy in the system and fill it, that way she will not need to call clinic. Patient verbalized understanding and gratitude. No further questions or concerns.

## 2014-04-04 ENCOUNTER — Encounter: Payer: Self-pay | Admitting: Nurse Practitioner

## 2014-05-05 ENCOUNTER — Encounter: Payer: Self-pay | Admitting: Obstetrics & Gynecology

## 2014-05-29 IMAGING — US US OB COMP +14 WK
1 series · 12 of 28 positions shown · non-contrast
Comparison: none

[Series 1: us ob detail +14 wk · 134 acquisitions, 12 frames shown]
[im 5/134]
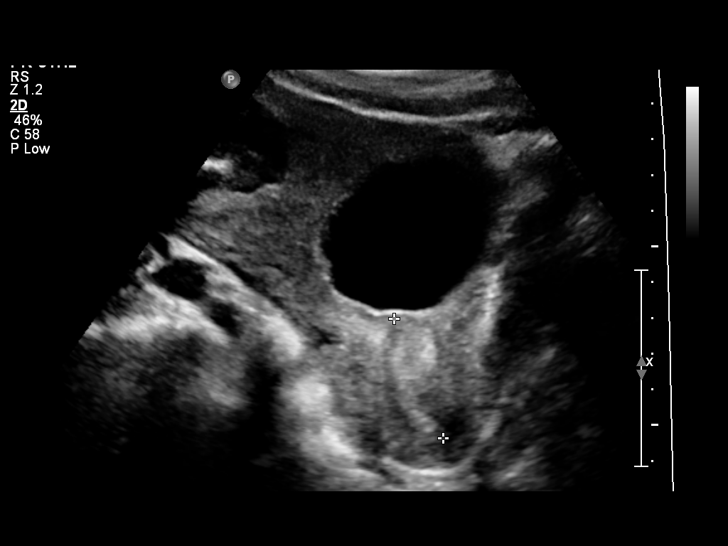
[im 15/134]
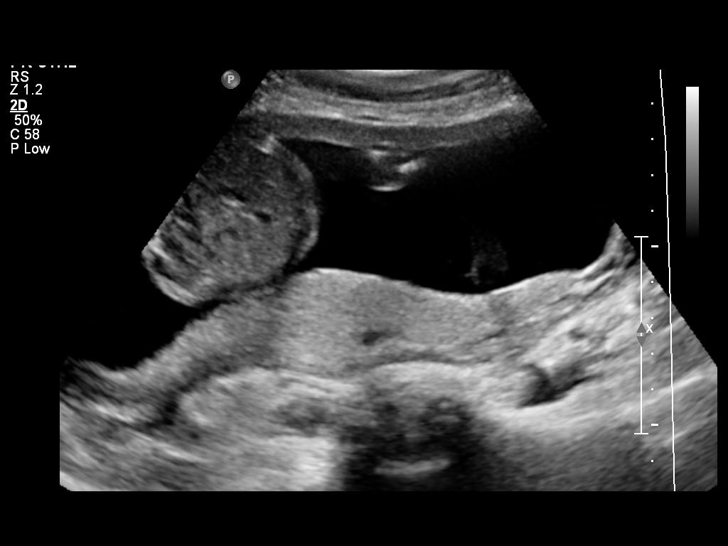
[im 25/134]
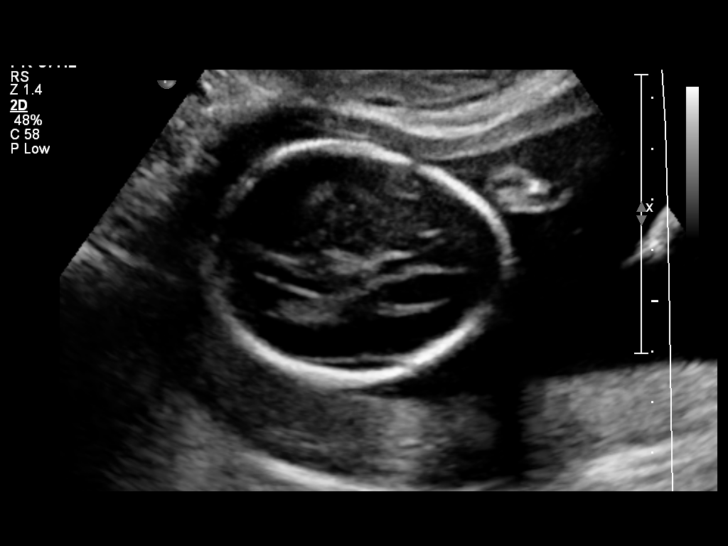
[im 40/134]
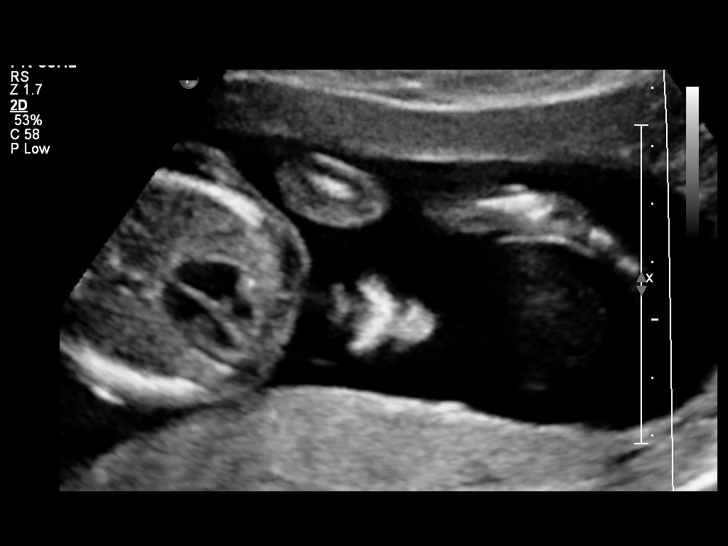
[im 50/134]
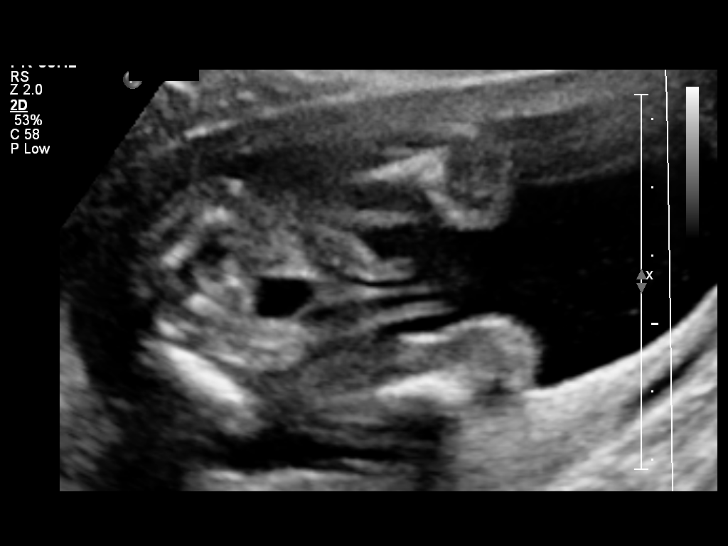
[im 60/134]
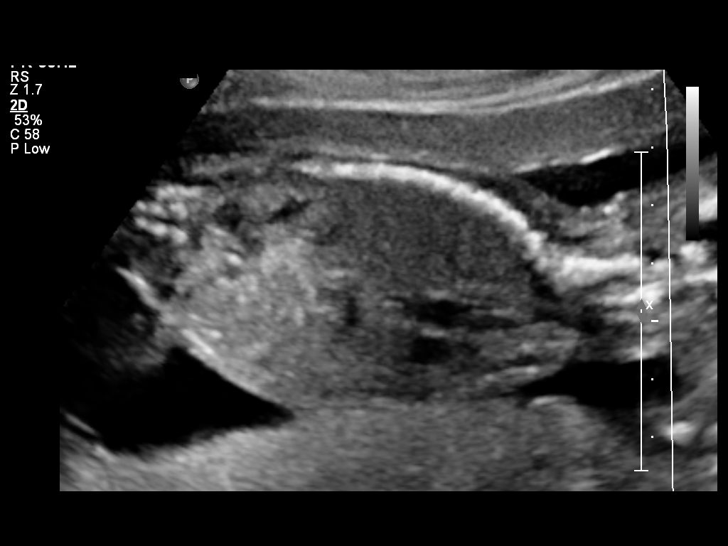
[im 74/134]
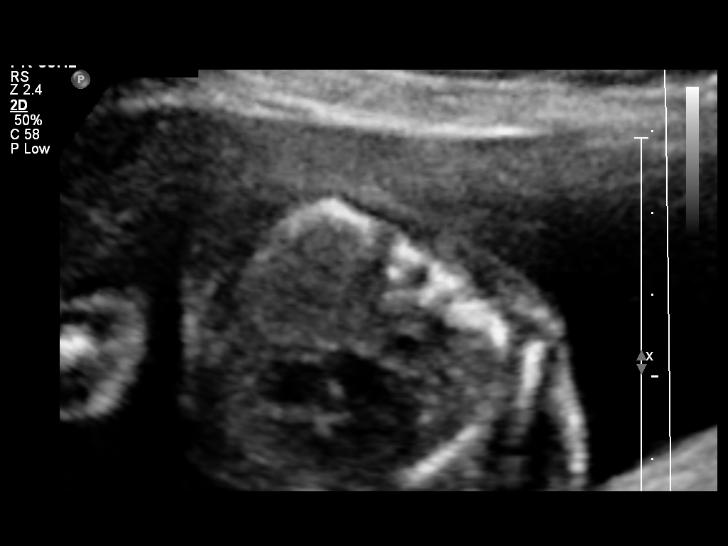
[im 84/134]
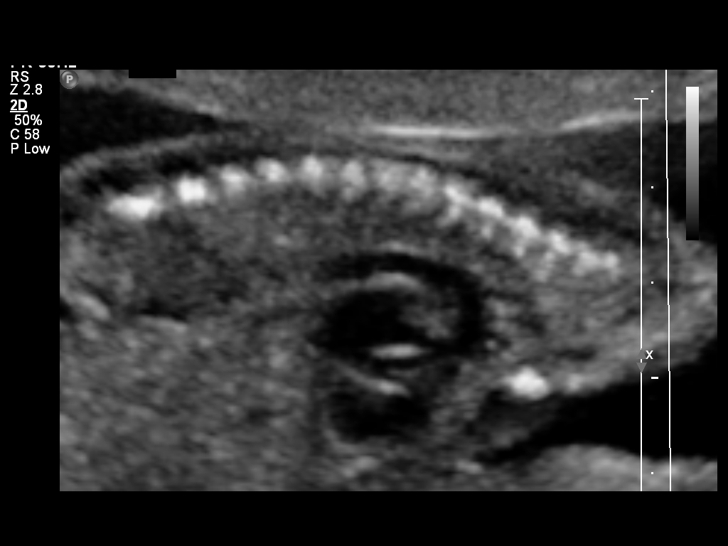
[im 94/134]
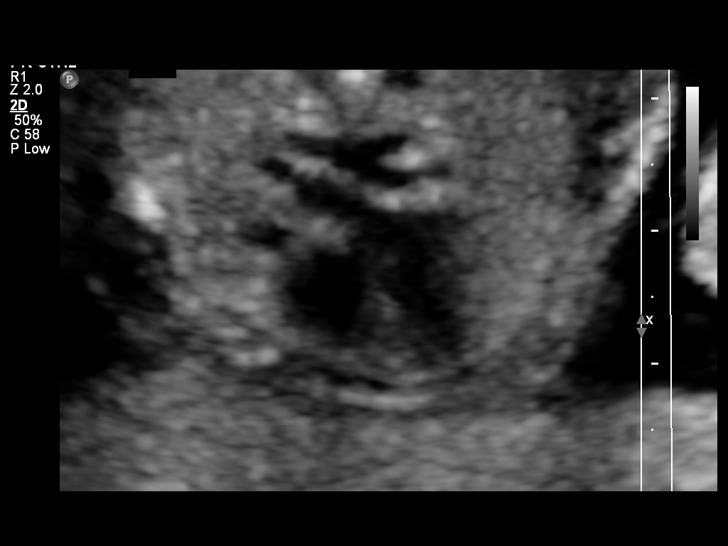
[im 109/134]
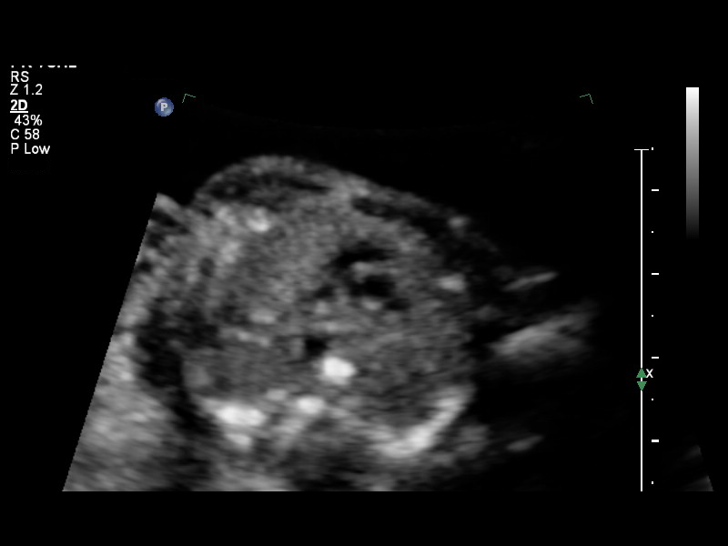
[im 119/134]
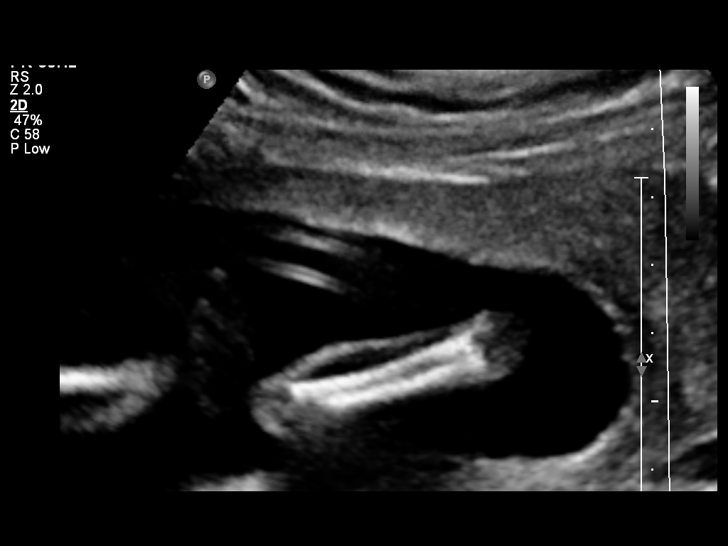
[im 129/134]
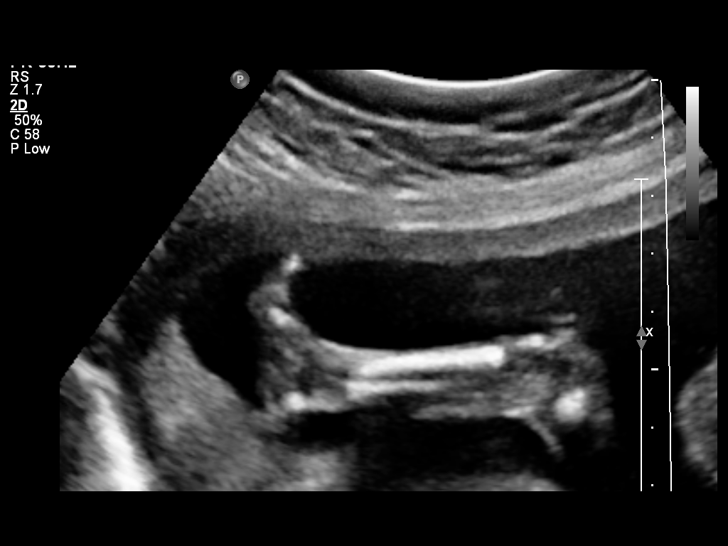

[12 of 28 positions shown; findings below may reference images not displayed]

OBSTETRICS REPORT
                      (Signed Final 08/11/2013 [DATE])

             [facility]

                                                         CNM
Service(s) Provided

 US OB COMP + 14 WK                                    76805.1
Indications

 Basic anatomic survey
Fetal Evaluation

 Num Of Fetuses:    1
 Fetal Heart Rate:  152                          bpm
 Cardiac Activity:  Observed
 Presentation:      Variable
 Placenta:          Posterior, above cervical
                    os
 P. Cord            Visualized, central
 Insertion:

 Amniotic Fluid
 AFI FV:      Subjectively within normal limits
                                             Larg Pckt:     7.4  cm
Biometry

 BPD:     45.8  mm     G. Age:  19w 6d                CI:        73.15   70 - 86
                                                      FL/HC:      18.3   16.8 -

 HC:     170.2  mm     G. Age:  19w 4d       21  %    HC/AC:      1.16   1.09 -

 AC:     147.3  mm     G. Age:  20w 0d       40  %    FL/BPD:
 FL:      31.1  mm     G. Age:  19w 4d       25  %    FL/AC:      21.1   20 - 24
 HUM:     29.4  mm     G. Age:  19w 4d       39  %
 CER:     19.4  mm     G. Age:  18w 5d       11  %
 NFT:     3.41  mm

 Est. FW:     315  gm    0 lb 11 oz      42  %
Gestational Age

 LMP:           20w 1d        Date:  03/23/13                 EDD:   12/28/13
 U/S Today:     19w 5d                                        EDD:   12/31/13
 Best:          20w 1d     Det. By:  LMP  (03/23/13)          EDD:   12/28/13
Anatomy
 Cranium:          Appears normal         Aortic Arch:      Appears normal
 Fetal Cavum:      Appears normal         Ductal Arch:      Appears normal
 Ventricles:       Appears normal         Diaphragm:        Appears normal
 Choroid Plexus:   Appears normal         Stomach:          Appears normal
 Cerebellum:       Appears normal         Abdomen:          Appears normal
 Posterior Fossa:  Appears normal         Abdominal Wall:   Appears nml (cord
                                                            insert, abd wall)
 Nuchal Fold:      Appears normal         Cord Vessels:     Appears normal (3
                                                            vessel cord)
 Face:             Appears normal         Kidneys:          Appear normal
                   (orbits and profile)
 Lips:             Appears normal         Bladder:          Appears normal
 Heart:            Appears normal         Spine:            Appears normal
                   (4CH, axis, and
                   situs)
 RVOT:             Appears normal         Lower             Appears normal
                                          Extremities:
 LVOT:             Appears normal         Upper             Appears normal
                                          Extremities:

 Other:  Fetus appears to be a male. Heels and 5th digit visualized. Nasal
         bone visualized.
Targeted Anatomy

 Fetal Central Nervous System
 Lat. Ventricles:  5.6                    Cisterna Magna:
Cervix Uterus Adnexa

 Cervical Length:    3.47     cm

 Cervix:       Normal appearance by transabdominal scan.
 Left Ovary:    Within normal limits.
 Right Ovary:   Within normal limits.

 Adnexa:     No abnormality visualized.
Impression

 Single IUP at 19 [DATE] weeks
 Normal fetal anatomic survey
 No markers associated with aneuploidy noted
 Posterior placenta without previa
 Normal amniotic fluid volume
Recommendations

 Follow-up ultrasounds as clinically indicated.

 TRIPTI TIGER with us.  Please do not hesitate to

## 2017-08-25 ENCOUNTER — Other Ambulatory Visit (HOSPITAL_COMMUNITY): Payer: Self-pay | Admitting: *Deleted

## 2017-08-25 DIAGNOSIS — N644 Mastodynia: Secondary | ICD-10-CM

## 2017-09-11 ENCOUNTER — Encounter (HOSPITAL_COMMUNITY): Payer: Self-pay

## 2017-09-11 ENCOUNTER — Ambulatory Visit (HOSPITAL_COMMUNITY)
Admission: RE | Admit: 2017-09-11 | Discharge: 2017-09-11 | Disposition: A | Discharge status: Home / Self Care | Payer: Self-pay | Source: Ambulatory Visit | Attending: Obstetrics and Gynecology | Admitting: Obstetrics and Gynecology

## 2017-09-11 VITALS — BP 137/75

## 2017-09-11 DIAGNOSIS — N644 Mastodynia: Secondary | ICD-10-CM

## 2017-09-11 DIAGNOSIS — Z1239 Encounter for other screening for malignant neoplasm of breast: Secondary | ICD-10-CM

## 2017-09-11 NOTE — Patient Instructions (Signed)
Explained breast self awareness with Diamond Baldwin. Patient did not need a Pap smear today due to last Pap smear was 03/11/2017. Let her know BCCCP will cover Pap smears every 3 years unless has a history of abnormal Pap smears. [facility]. Appointment scheduled for Thursday, September 11, 2017 at 1050. Diamond Baldwin verbalized understanding.  Brannock, Kathaleen Maserhristine Poll, RN 4:10 PM

## 2017-09-11 NOTE — Progress Notes (Signed)
Complaints of right outer breast pain x 1 month that comes and goes. Patient rates the pain at a 7-8 out of 10.  Pap Smear: Pap smear not completed today. [facility] and normal. Per patient has no history of an abnormal Pap smear. Last Pap smear result is not in Epic. Previous Pap smear result 06/02/2013 is in Epic.  Physical exam: Breasts Breasts symmetrical. No skin abnormalities bilateral breasts. No nipple retraction bilateral breasts. No nipple discharge bilateral breasts. No lymphadenopathy. No lumps palpated bilateral breasts. Complaints of right outer breast tenderness at 9 o'clock next to the areola on exam. [facility]. Appointment scheduled for Thursday, September 11, 2017 at 1050.        Pelvic/Bimanual No Pap smear completed today since last Pap smear was 03/11/2017. Pap smear not indicated per BCCCP guidelines.   Smoking History: Patient has never smoked.  Patient Navigation: Patient education provided. Access to services provided for patient through [facility] program. Spanish interpreter provided.   Breast and Cervical Cancer Risk Assessment: Patient has no family history of breast cancer, known genetic mutations, or radiation treatment to the chest before age 32. Patient has no history of cervical dysplasia, immunocompromised, or DES exposure in-utero.  Used Spanish interpreter [employer]rika McReynolds from [employer].

## 2017-09-12 ENCOUNTER — Ambulatory Visit
Admission: RE | Admit: 2017-09-12 | Discharge: 2017-09-12 | Disposition: A | Discharge status: Home / Self Care | Payer: No Typology Code available for payment source | Source: Ambulatory Visit | Attending: Obstetrics and Gynecology | Admitting: Obstetrics and Gynecology

## 2017-09-12 DIAGNOSIS — N644 Mastodynia: Secondary | ICD-10-CM

## 2017-09-22 ENCOUNTER — Encounter (HOSPITAL_COMMUNITY): Payer: Self-pay | Admitting: *Deleted

## 2019-08-11 ENCOUNTER — Other Ambulatory Visit: Payer: Self-pay

## 2019-08-11 DIAGNOSIS — N644 Mastodynia: Secondary | ICD-10-CM

## 2019-09-07 ENCOUNTER — Ambulatory Visit: Payer: No Typology Code available for payment source

## 2019-09-07 ENCOUNTER — Other Ambulatory Visit: Payer: No Typology Code available for payment source

## 2019-09-16 ENCOUNTER — Ambulatory Visit: Payer: Self-pay

## 2019-09-16 ENCOUNTER — Other Ambulatory Visit: Payer: Self-pay

## 2019-09-16 VITALS — BP 116/78 | Temp 98.2°F | Wt 122.5 lb

## 2019-09-16 DIAGNOSIS — Z1239 Encounter for other screening for malignant neoplasm of breast: Secondary | ICD-10-CM

## 2019-09-16 DIAGNOSIS — N644 Mastodynia: Secondary | ICD-10-CM

## 2019-09-16 NOTE — Progress Notes (Signed)
Diamond Baldwin is a 34 y.o. female who presents to [facility] clinic today for clinical breast exam.    Breast History:  She reports that she has been experiencing right side pain for the past 4 months.  She states the pain is intermittent and she describes the pain as constant cramping and intermittent stabbing.  Patient reports the pain is improved with cold compresses and tylenol or ibuprofen when the pain is bad.  Patient reports she does SBE twice weekly and has noticed the tenderness during that time.  She reports the pain causes disruptions in sleep and is increased when working.     Gynecological History: Pap smear not completed today. Last Pap smear was Oct 2018 at [facility] and was normal. Per patient has no history of an abnormal Pap smear. Last Pap smear result is not available in Epic.   Physical exam: Breasts Breasts symmetrical. No skin abnormalities bilateral breasts. No nipple retraction bilateral breasts. No nipple discharge bilateral breasts. No lymphadenopathy. No lumps palpated bilateral breasts. Left breast with notable swelling near axillary. Tender to touch.  No skin changes or bruising noted.       Pelvic/Bimanual Pap is not indicated today    Smoking History: Patient has never smoked.    Patient Navigation: Patient education provided. Access to services provided for patient through [facility] program. interpreter provided.   Colorectal Cancer Screening: Per patient has never had colonoscopy completed No complaints today.    Breast and Cervical Cancer Risk Assessment: Patient does not have family history of breast cancer, known genetic mutations, or radiation treatment to the chest before age 65. Patient does not have history of cervical dysplasia, immunocompromised, or DES exposure in-utero.  Risk Assessment    Risk Scores      09/16/2019   Last edited by: Diamond Rutherford, LPN   5-year risk:    Lifetime risk:           A: 34 year old Clinical Breast  Exam Right Breast Tenderness  P: -Referred patient to the [facility] for a diagnostic mammogram. Appointment scheduled April 20th at 950am. -Discussed possibility of musculoskeletal manifestation considering location.  Reviewed methods for treating if mammogram returns benign including ibuprofen and cold compresses.   Diamond Baldwin, CNM 09/16/2019 1:59 PM

## 2019-09-21 ENCOUNTER — Other Ambulatory Visit: Payer: Self-pay

## 2019-09-21 ENCOUNTER — Ambulatory Visit
Admission: RE | Admit: 2019-09-21 | Discharge: 2019-09-21 | Disposition: A | Discharge status: Home / Self Care | Payer: No Typology Code available for payment source | Source: Ambulatory Visit | Attending: Obstetrics and Gynecology | Admitting: Obstetrics and Gynecology

## 2019-09-21 DIAGNOSIS — N644 Mastodynia: Secondary | ICD-10-CM

## 2023-06-04 NOTE — L&D Delivery Note (Signed)
 OB/GYN Faculty Practice Delivery Note  Diamond Baldwin is a 38 y.o. G3P2002 s/p SVB at [redacted]w[redacted]d. She was admitted for SOL.   ROM: 0h 35m with clear fluid GBS Status: positive Maximum Maternal Temperature: 98.3  Labor Progress: SOL with progression to complete and pushing  Delivery Date/Time: 03/01/2024 @ 0953 Delivery: Called to room and patient was complete and pushing. Head delivered OA to ROT. No nuchal cord present. Shoulder and body delivered in usual fashion. Infant with spontaneous cry, placed on mother's abdomen, dried and stimulated. Cord clamped x 2 after 2-minute delay, and cut by FOB. Cord blood drawn. Placenta delivered spontaneously, intact, with 3-vessel cord. Fundus firm with massage and Pitocin . Labia, perineum, vagina, and cervix inspected, no laceration identified, excellent hemostasis and approximation noted, no repair indicated.   Placenta: spontaneous, intact, Shultz to path due to accessory lobe Complications: NA Lacerations: intact, excellent hemostasis and approximation noted EBL: 52 Analgesia: NA  Postpartum Planning [ X] transfer orders to MB [ X] discharge summary started & shared Gal[employer] ] message to sent to schedule follow-up  X ] lists updated  Infant: girl  APGARs 9/9  pending  Camie Rote, MSN, CNM, RNC-OB Certified Nurse Midwife, [facility] 03/01/2024 10:13 AM

## 2023-09-29 DIAGNOSIS — Z3480 Encounter for supervision of other normal pregnancy, unspecified trimester: Secondary | ICD-10-CM | POA: Insufficient documentation

## 2023-09-30 ENCOUNTER — Encounter: Payer: Self-pay | Admitting: Obstetrics & Gynecology

## 2023-09-30 ENCOUNTER — Other Ambulatory Visit (HOSPITAL_COMMUNITY)
Admission: RE | Admit: 2023-09-30 | Discharge: 2023-09-30 | Disposition: A | Discharge status: Home / Self Care | Payer: Self-pay | Source: Ambulatory Visit | Attending: Obstetrics & Gynecology | Admitting: Obstetrics & Gynecology

## 2023-09-30 ENCOUNTER — Ambulatory Visit (INDEPENDENT_AMBULATORY_CARE_PROVIDER_SITE_OTHER): Payer: Self-pay | Admitting: Obstetrics & Gynecology

## 2023-09-30 VITALS — BP 132/84 | HR 89 | Wt 123.0 lb

## 2023-09-30 DIAGNOSIS — Z3A18 18 weeks gestation of pregnancy: Secondary | ICD-10-CM

## 2023-09-30 DIAGNOSIS — O09529 Supervision of elderly multigravida, unspecified trimester: Secondary | ICD-10-CM

## 2023-09-30 DIAGNOSIS — Z3480 Encounter for supervision of other normal pregnancy, unspecified trimester: Secondary | ICD-10-CM

## 2023-09-30 DIAGNOSIS — O09522 Supervision of elderly multigravida, second trimester: Secondary | ICD-10-CM

## 2023-09-30 DIAGNOSIS — R8271 Bacteriuria: Secondary | ICD-10-CM

## 2023-09-30 NOTE — Progress Notes (Signed)
  Subjective:AMA    Diamond Baldwin is a [id number] [redacted]w[redacted]d being seen today for her first obstetrical visit.  Her obstetrical history is significant for advanced maternal age. Patient does intend to breast feed. Pregnancy history fully reviewed.  Patient reports no complaints.  Vitals:   09/30/23 1324  BP: 132/84  Pulse: 89  Weight: 123 lb (55.8 kg)    HISTORY: OB History  Gravida Para Term Preterm AB Living  3 2 2  0 0 2  SAB IAB Ectopic Multiple Live Births  0 0 0 0 2    # Outcome Date GA Lbr Len/2nd Weight Sex Type Anes PTL Lv  3 Current           2 Term 12/28/13 [redacted]w[redacted]d 03:56 / 00:06 7 lb 5.2 oz (3.323 kg) M Vag-Spont None  LIV  1 Term 01/23/05 [redacted]w[redacted]d  6 lb 13 oz (3.09 kg) F Vag-Spont   LIV   Past Medical History:  Diagnosis Date   Hypertension    Past Surgical History:  Procedure Laterality Date   NO PAST SURGERIES     No family history on file.   Exam    Uterus:   19 week  Pelvic Exam:    Perineum: No Hemorrhoids, Normal Perineum   Vulva: normal   Vagina:  normal mucosa   pH:    Cervix: no lesions   Adnexa: normal adnexa   Bony Pelvis: average  System: Breast:  Inspection negative   Skin: normal coloration and turgor, no rashes    Neurologic: oriented, normal mood   Extremities: normal strength, tone, and muscle mass   HEENT PERRLA, sclera clear, anicteric, and oropharynx clear, no lesions   Mouth/Teeth mucous membranes moist, pharynx normal without lesions   Neck supple   Cardiovascular: regular rate and rhythm, no murmurs or gallops   Respiratory:  appears well, vitals normal, no respiratory distress, acyanotic, normal RR, chest clear, no wheezing, crepitations, rhonchi, normal symmetric air entry   Abdomen: soft, non-tender; bowel sounds normal; no masses,  no organomegaly   Urinary: urethral meatus normal      Assessment:    Pregnancy: [id number] Patient Active Problem List   Diagnosis Date Noted   Supervision of other normal pregnancy,  antepartum 09/29/2023   Postpartum care and examination 02/14/2014   Contraception management 02/14/2014        Plan:     Initial labs drawn. Prenatal vitamins. Problem list reviewed and updated. Genetic Screening discussed : ordered.  Ultrasound discussed; fetal survey: ordered.  Follow up in 4 weeks. 50% of 30 min visit spent on counseling and coordination of care.  [employer] patient   Onnie Bilis 09/30/2023

## 2023-10-01 LAB — CBC/D/PLT+RPR+RH+ABO+RUBIGG...
Antibody Screen: NEGATIVE
Basophils Absolute: 0 10*3/uL (ref 0.0–0.2)
Basos: 0 %
EOS (ABSOLUTE): 0.1 10*3/uL (ref 0.0–0.4)
Eos: 2 %
HCV Ab: NONREACTIVE
HIV Screen 4th Generation wRfx: NONREACTIVE
Hematocrit: 36.1 % (ref 34.0–46.6)
Hemoglobin: 12.2 g/dL (ref 11.1–15.9)
Hepatitis B Surface Ag: NEGATIVE
Immature Grans (Abs): 0.1 10*3/uL (ref 0.0–0.1)
Immature Granulocytes: 1 %
Lymphocytes Absolute: 1.7 10*3/uL (ref 0.7–3.1)
Lymphs: 21 %
MCH: 32.4 pg (ref 26.6–33.0)
MCHC: 33.8 g/dL (ref 31.5–35.7)
MCV: 96 fL (ref 79–97)
Monocytes Absolute: 0.4 10*3/uL (ref 0.1–0.9)
Monocytes: 5 %
Neutrophils Absolute: 5.8 10*3/uL (ref 1.4–7.0)
Neutrophils: 71 %
Platelets: 314 10*3/uL (ref 150–450)
RBC: 3.76 x10E6/uL — ABNORMAL LOW (ref 3.77–5.28)
RDW: 12.5 % (ref 11.7–15.4)
RPR Ser Ql: NONREACTIVE
Rh Factor: POSITIVE
Rubella Antibodies, IGG: 9.48 {index} (ref >0.99)
WBC: 8.1 10*3/uL (ref 3.4–10.8)

## 2023-10-01 LAB — CERVICOVAGINAL ANCILLARY ONLY
Chlamydia: NEGATIVE
Comment: NEGATIVE
Comment: NORMAL
Neisseria Gonorrhea: NEGATIVE

## 2023-10-01 LAB — HEMOGLOBIN A1C
Est. average glucose Bld gHb Est-mCnc: 97 mg/dL
Hgb A1c MFr Bld: 5 % (ref 4.8–5.6)

## 2023-10-01 LAB — HCV INTERPRETATION

## 2023-10-02 LAB — CYTOLOGY - PAP
Comment: NEGATIVE
Diagnosis: NEGATIVE
High risk HPV: NEGATIVE

## 2023-10-04 LAB — URINE CULTURE, OB REFLEX

## 2023-10-04 LAB — CULTURE, OB URINE

## 2023-10-06 LAB — PANORAMA PRENATAL TEST FULL PANEL:PANORAMA TEST PLUS 5 ADDITIONAL MICRODELETIONS: FETAL FRACTION: 14.7

## 2023-10-08 LAB — HORIZON CUSTOM: REPORT SUMMARY: NEGATIVE

## 2023-10-10 DIAGNOSIS — R8271 Bacteriuria: Secondary | ICD-10-CM | POA: Insufficient documentation

## 2023-10-10 MED ORDER — AMOXICILLIN 500 MG PO CAPS: 500.0000 mg | ORAL_CAPSULE | Freq: Three times a day (TID) | ORAL | 0 refills | Status: DC

## 2023-10-10 NOTE — Addendum Note (Signed)
 Addended by: Tresia Fruit on: 10/10/2023 10:49 AM   Modules accepted: Orders

## 2023-10-14 ENCOUNTER — Ambulatory Visit: Payer: Self-pay

## 2023-10-28 ENCOUNTER — Encounter: Payer: Self-pay | Admitting: Certified Nurse Midwife

## 2023-10-28 ENCOUNTER — Ambulatory Visit (INDEPENDENT_AMBULATORY_CARE_PROVIDER_SITE_OTHER): Payer: Self-pay | Admitting: Certified Nurse Midwife

## 2023-10-28 VITALS — BP 115/73 | HR 71 | Wt 129.0 lb

## 2023-10-28 DIAGNOSIS — Z3402 Encounter for supervision of normal first pregnancy, second trimester: Secondary | ICD-10-CM

## 2023-10-28 DIAGNOSIS — Z603 Acculturation difficulty: Secondary | ICD-10-CM

## 2023-10-28 DIAGNOSIS — O2342 Unspecified infection of urinary tract in pregnancy, second trimester: Secondary | ICD-10-CM

## 2023-10-28 DIAGNOSIS — Z758 Other problems related to medical facilities and other health care: Secondary | ICD-10-CM

## 2023-10-28 DIAGNOSIS — Z3A22 22 weeks gestation of pregnancy: Secondary | ICD-10-CM

## 2023-10-28 NOTE — Progress Notes (Unsigned)
 Pt presents for ROB visit. Pt has not been scheduled for US  with [facility] yet.   Pt was unable to get antibiotic of UTI.

## 2023-10-28 NOTE — Patient Instructions (Addendum)
  Masaje y estiramientos del ligamento redondo  Masaje: Comenzando por la mitad del pubis, traza pequeos crculos en forma de U desde el pubis [employer] de la cadera a ambos lados. Luego, comenzando justo por encima del pubis, presiona hacia adentro y hacia abajo, alternando los lados para crear un suave balanceo del tero. Mueve las manos hacia arriba por los lados del abdomen y [place]. Repite esto de 3 a 5 veces al despertar y antes de acostarte.  Estiramientos: Ponte a cuatro patas y alterna arqueando la espalda profundamente al inhalar y luego arquendola al exhalar. Zancada de corredor modificada: - Sintate en una silla con la mitad de los glteos apoyados y la otra mitad fuera de ella. - Sintate erguido, planta el pie delantero y Research officer, trade union atrs. - Respira profundamente durante 5 respiraciones y luego repite el ejercicio del otro lado.

## 2023-10-29 NOTE — Progress Notes (Signed)
   PRENATAL VISIT NOTE  Subjective:  Diamond Baldwin is a 38 y.o. G3P2002 at [redacted]w[redacted]d being seen today for ongoing prenatal care.  She is currently monitored for the following issues for this low-risk pregnancy and has Postpartum care and examination; Contraception management; Supervision of other normal pregnancy, antepartum; and GBS bacteriuria on their problem list.  Patient reports no complaints.  Contractions: Not present. Vag. Bleeding: None.  Movement: Present. Denies leaking of fluid.   The following portions of the patient's history were reviewed and updated as appropriate: allergies, current medications, past family history, past medical history, past social history, past surgical history and problem list.   Objective:    Vitals:   10/28/23 1459  BP: 115/73  Pulse: 71  Weight: 129 lb (58.5 kg)    Fetal Status:      Movement: Present    General: Alert, oriented and cooperative. Patient is in no acute distress.  Skin: Skin is warm and dry. No rash noted.   Cardiovascular: Normal heart rate noted  Respiratory: Normal respiratory effort, no problems with respiration noted  Abdomen: Soft, gravid, appropriate for gestational age.  Pain/Pressure: Absent     Pelvic: Cervical exam deferred        Extremities: Normal range of motion.  Edema: None  Mental Status: Normal mood and affect. Normal behavior. Normal judgment and thought content.   Assessment and Plan:  Pregnancy: G3P2002 at [redacted]w[redacted]d 1. Encounter for supervision of low-risk first pregnancy in second trimester (Primary) - Patient doing well.  - Patient reports feeling occasional flutters and taps.    2. [redacted] weeks gestation of pregnancy - 2nd trimester expectations reviewed.  - Message sent to front desk to get scheduled for [facility] program  - POCT Urinalysis Dipstick - Urine Culture  3. Language barrier - Due to language barrier, an interpreter was present during the history-taking and subsequent discussion  (and for part of the physical exam) with this patient. - In-person spanish interpreter used for the duration of this visit.   4. Urinary tract infection in mother during second trimester of pregnancy - Previously had a UTI, did not receive treatment, if normal may have cleared on its on.  - POCT Urinalysis Dipstick - Urine Culture  Preterm labor symptoms and general obstetric precautions including but not limited to vaginal bleeding, contractions, leaking of fluid and fetal movement were reviewed in detail with the patient. Please refer to After Visit Summary for other counseling recommendations.   Return in about 4 weeks (around 11/25/2023) for LOB.  Future Appointments  Date Time Provider Department Center  11/26/2023  8:15 AM Davis, Devon E, PA-C [facility] None    Julien Berryman (Maurie Southern) Marlys Singh, MSN, CNM  Center for [employer]  10/29/2023 2:02 PM

## 2023-10-30 ENCOUNTER — Ambulatory Visit: Payer: Self-pay | Admitting: Certified Nurse Midwife

## 2023-10-30 LAB — URINE CULTURE

## 2023-11-11 ENCOUNTER — Other Ambulatory Visit: Payer: Self-pay | Admitting: *Deleted

## 2023-11-11 DIAGNOSIS — Z3A24 24 weeks gestation of pregnancy: Secondary | ICD-10-CM

## 2023-11-11 DIAGNOSIS — Z3402 Encounter for supervision of normal first pregnancy, second trimester: Secondary | ICD-10-CM

## 2023-11-17 ENCOUNTER — Other Ambulatory Visit: Payer: Self-pay | Admitting: *Deleted

## 2023-11-17 DIAGNOSIS — Z3402 Encounter for supervision of normal first pregnancy, second trimester: Secondary | ICD-10-CM

## 2023-11-17 NOTE — Progress Notes (Signed)
 Change in u/s order to be scheduled at [facility].

## 2023-11-19 ENCOUNTER — Other Ambulatory Visit: Payer: Self-pay | Admitting: *Deleted

## 2023-11-19 DIAGNOSIS — Z3480 Encounter for supervision of other normal pregnancy, unspecified trimester: Secondary | ICD-10-CM

## 2023-11-19 NOTE — Progress Notes (Signed)
 Change in u/s order to Detail due to AMA status.

## 2023-11-26 ENCOUNTER — Ambulatory Visit (INDEPENDENT_AMBULATORY_CARE_PROVIDER_SITE_OTHER): Payer: Self-pay | Admitting: Physician Assistant

## 2023-11-26 ENCOUNTER — Other Ambulatory Visit (HOSPITAL_COMMUNITY)
Admission: RE | Admit: 2023-11-26 | Discharge: 2023-11-26 | Disposition: A | Discharge status: Home / Self Care | Payer: Self-pay | Source: Ambulatory Visit | Attending: Physician Assistant | Admitting: Physician Assistant

## 2023-11-26 VITALS — BP 122/81 | HR 76 | Wt 127.9 lb

## 2023-11-26 DIAGNOSIS — Z3480 Encounter for supervision of other normal pregnancy, unspecified trimester: Secondary | ICD-10-CM

## 2023-11-26 DIAGNOSIS — Z3A26 26 weeks gestation of pregnancy: Secondary | ICD-10-CM

## 2023-11-26 NOTE — Progress Notes (Signed)
   PRENATAL VISIT NOTE  Subjective:  Diamond Baldwin is a 38 y.o. G3P2002 at [redacted]w[redacted]d being seen today for ongoing prenatal care.  She is currently monitored for the following issues for this low-risk pregnancy and has Postpartum care and examination; Contraception management; Supervision of other normal pregnancy, antepartum; and GBS bacteriuria on their problem list.  Patient reports vaginal itching.  Contractions: Irritability. Vag. Bleeding: None.  Movement: Present. Denies leaking of fluid.   The following portions of the patient's history were reviewed and updated as appropriate: allergies, current medications, past family history, past medical history, past social history, past surgical history and problem list.   Objective:    Vitals:   11/26/23 0810  BP: 122/81  Pulse: 76  Weight: 127 lb 14.4 oz (58 kg)    Fetal Status:  Fetal Heart Rate (bpm): 141 Fundal Height: 26 cm Movement: Present    General: Alert, oriented and cooperative. Patient is in no acute distress.  Skin: Skin is warm and dry. No rash noted.   Cardiovascular: Normal heart rate noted  Respiratory: Normal respiratory effort, no problems with respiration noted  Abdomen: Soft, gravid, appropriate for gestational age.  Pain/Pressure: Present     Pelvic: Cervical exam deferred        Extremities: Normal range of motion.  Edema: None  Mental Status: Normal mood and affect. Normal behavior. Normal judgment and thought content.   Assessment and Plan:  Pregnancy: G3P2002 at [redacted]w[redacted]d  1. Supervision of other normal pregnancy, antepartum (Primary) Patient doing well, feeling regular fetal movement  BP, FHR, FH appropriate  2. [redacted] weeks gestation of pregnancy Anticipatory guidance about next visits/weeks of pregnancy given.  12/29/23 anatomy scan   Preterm labor symptoms and general obstetric precautions including but not limited to vaginal bleeding, contractions, leaking of fluid and fetal movement were  reviewed in detail with the patient.  Please refer to After Visit Summary for other counseling recommendations.   Return in about 2 weeks (around 12/10/2023) for LOB+GTT.  Future Appointments  Date Time Provider Department Center  12/29/2023  9:00 AM [facility] PROVIDER 1 [facility]  12/29/2023  9:30 AM [facility] US4 [facility]    Jorene FORBES Moats, PA-C

## 2023-11-26 NOTE — Progress Notes (Signed)
 Pt presents for rob. Pt complains of vaginal itching and pressure.

## 2023-11-27 LAB — CERVICOVAGINAL ANCILLARY ONLY
Bacterial Vaginitis (gardnerella): NEGATIVE
Candida Glabrata: NEGATIVE
Candida Vaginitis: NEGATIVE
Chlamydia: NEGATIVE
Comment: NEGATIVE
Comment: NEGATIVE
Comment: NEGATIVE
Comment: NEGATIVE
Comment: NEGATIVE
Comment: NORMAL
Neisseria Gonorrhea: NEGATIVE
Trichomonas: NEGATIVE

## 2023-11-29 ENCOUNTER — Ambulatory Visit: Payer: Self-pay | Admitting: Physician Assistant

## 2023-12-07 NOTE — Progress Notes (Unsigned)
   PRENATAL VISIT NOTE  Subjective:  Diamond Baldwin is a 38 y.o. G3P2002 at [redacted]w[redacted]d being seen today for ongoing prenatal care.  She is currently monitored for the following issues for this low risk pregnancy and has Postpartum care and examination; Contraception management; Supervision of other normal pregnancy, antepartum; and GBS bacteriuria on their problem list.  Patient reports continued dysuria, urinary frequency. Patient reports did not get call from pharmacy to pick up antibiotics, has not started taking them. +Vaginal itching today.   Contractions: Irritability. Vag. Bleeding: None.  Movement: Present. Denies leaking of fluid.   The following portions of the patient's history were reviewed and updated as appropriate: allergies, current medications, past family history, past medical history, past social history, past surgical history and problem list.   Objective:    Vitals:   12/10/23 0945  BP: 120/77  Pulse: 79  Weight: 131 lb 9.6 oz (59.7 kg)    Fetal Status:  Fetal Heart Rate (bpm): 140 Fundal Height: 29 cm Movement: Present    General: Alert, oriented and cooperative. Patient is in no acute distress.  Skin: Skin is warm and dry. No rash noted.   Cardiovascular: Normal heart rate noted  Respiratory: Normal respiratory effort, no problems with respiration noted  Abdomen: Soft, gravid, appropriate for gestational age.  Pain/Pressure: Present     Pelvic: Cervical exam deferred        Extremities: Normal range of motion.  Edema: Trace  Mental Status: Normal mood and affect. Normal behavior. Normal judgment and thought content.   Assessment and Plan:  Pregnancy: G3P2002 at [redacted]w[redacted]d  1. Supervision of other normal pregnancy, antepartum (Primary) Patient doing well, feeling regular fetal movement BP, FHR, FH appropriate  2. [redacted] weeks gestation of pregnancy Anticipatory guidance regarding next visit/weeks of pregnancy given 2 hr GTT/28 w labs today 12/29/2023  anatomy scan  3. AMA (advanced maternal age) primigravida 35+, third trimester LR NIPT  4. Language barrier Spanish interpreter utilized during encounter  5. Dysuria during pregnancy in third trimester GBS bacteriuria 10/28/23, never started treatment, now with continued urinary sxs - amoxicillin  (AMOXIL ) 500 MG capsule; Take 1 capsule (500 mg total) by mouth 3 (three) times daily.  Dispense: 21 capsule; Refill: 0  6. Vaginal itching - Cervicovaginal ancillary only   Preterm labor symptoms and general obstetric precautions including but not limited to vaginal bleeding, contractions, leaking of fluid and fetal movement were reviewed in detail with the patient.  Please refer to After Visit Summary for other counseling recommendations.   Return in about 2 weeks (around 12/24/2023) for LOB.  Future Appointments  Date Time Provider Department Center  12/10/2023 11:15 AM Maha Fischel E, PA-C [facility] None  12/29/2023  9:00 AM [facility] PROVIDER 1 [facility]  12/29/2023  9:30 AM [facility] US4 [facility]    Jorene FORBES Moats, PA-C

## 2023-12-10 ENCOUNTER — Ambulatory Visit (INDEPENDENT_AMBULATORY_CARE_PROVIDER_SITE_OTHER): Payer: Self-pay | Admitting: Physician Assistant

## 2023-12-10 ENCOUNTER — Other Ambulatory Visit: Payer: Self-pay

## 2023-12-10 ENCOUNTER — Encounter: Payer: Self-pay | Admitting: Physician Assistant

## 2023-12-10 ENCOUNTER — Other Ambulatory Visit (HOSPITAL_COMMUNITY)
Admission: RE | Admit: 2023-12-10 | Discharge: 2023-12-10 | Disposition: A | Discharge status: Home / Self Care | Payer: Self-pay | Source: Ambulatory Visit | Attending: Physician Assistant | Admitting: Physician Assistant

## 2023-12-10 VITALS — BP 120/77 | HR 79 | Wt 131.6 lb

## 2023-12-10 DIAGNOSIS — O09513 Supervision of elderly primigravida, third trimester: Secondary | ICD-10-CM

## 2023-12-10 DIAGNOSIS — Z3A28 28 weeks gestation of pregnancy: Secondary | ICD-10-CM

## 2023-12-10 DIAGNOSIS — Z3480 Encounter for supervision of other normal pregnancy, unspecified trimester: Secondary | ICD-10-CM

## 2023-12-10 DIAGNOSIS — Z603 Acculturation difficulty: Secondary | ICD-10-CM

## 2023-12-10 DIAGNOSIS — N898 Other specified noninflammatory disorders of vagina: Secondary | ICD-10-CM

## 2023-12-10 DIAGNOSIS — R300 Dysuria: Secondary | ICD-10-CM

## 2023-12-10 DIAGNOSIS — Z758 Other problems related to medical facilities and other health care: Secondary | ICD-10-CM

## 2023-12-10 DIAGNOSIS — O26893 Other specified pregnancy related conditions, third trimester: Secondary | ICD-10-CM

## 2023-12-10 MED ORDER — AMOXICILLIN 500 MG PO CAPS: 500.0000 mg | ORAL_CAPSULE | Freq: Three times a day (TID) | ORAL | 0 refills | Status: DC

## 2023-12-10 NOTE — Progress Notes (Signed)
 Pt presents for ROB visit. No concerns

## 2023-12-11 LAB — CERVICOVAGINAL ANCILLARY ONLY
Chlamydia: NEGATIVE
Comment: NEGATIVE
Comment: NEGATIVE
Comment: NORMAL
Neisseria Gonorrhea: NEGATIVE
Trichomonas: NEGATIVE

## 2023-12-11 LAB — GLUCOSE TOLERANCE, 2 HOURS W/ 1HR
Glucose, 1 hour: 140 mg/dL (ref 70–179)
Glucose, 2 hour: 121 mg/dL (ref 70–152)
Glucose, Fasting: 70 mg/dL (ref 70–91)

## 2023-12-11 LAB — CBC
Hematocrit: 38.5 % (ref 34.0–46.6)
Hemoglobin: 12.5 g/dL (ref 11.1–15.9)
MCH: 31 pg (ref 26.6–33.0)
MCHC: 32.5 g/dL (ref 31.5–35.7)
MCV: 96 fL (ref 79–97)
Platelets: 282 x10E3/uL (ref 150–450)
RBC: 4.03 x10E6/uL (ref 3.77–5.28)
RDW: 11.2 % — ABNORMAL LOW (ref 11.7–15.4)
WBC: 7.9 x10E3/uL (ref 3.4–10.8)

## 2023-12-11 LAB — RPR: RPR Ser Ql: NONREACTIVE

## 2023-12-11 LAB — HIV ANTIBODY (ROUTINE TESTING W REFLEX): HIV Screen 4th Generation wRfx: NONREACTIVE

## 2023-12-12 ENCOUNTER — Ambulatory Visit: Payer: Self-pay | Admitting: Obstetrics and Gynecology

## 2023-12-19 ENCOUNTER — Ambulatory Visit: Payer: Self-pay | Admitting: Physician Assistant

## 2023-12-24 ENCOUNTER — Ambulatory Visit (INDEPENDENT_AMBULATORY_CARE_PROVIDER_SITE_OTHER): Payer: Self-pay | Admitting: Obstetrics and Gynecology

## 2023-12-24 VITALS — BP 106/72 | HR 76 | Wt 135.0 lb

## 2023-12-24 DIAGNOSIS — O99820 Streptococcus B carrier state complicating pregnancy: Secondary | ICD-10-CM

## 2023-12-24 DIAGNOSIS — Z3480 Encounter for supervision of other normal pregnancy, unspecified trimester: Secondary | ICD-10-CM

## 2023-12-24 DIAGNOSIS — Z3A30 30 weeks gestation of pregnancy: Secondary | ICD-10-CM

## 2023-12-24 DIAGNOSIS — Z603 Acculturation difficulty: Secondary | ICD-10-CM

## 2023-12-24 DIAGNOSIS — O09513 Supervision of elderly primigravida, third trimester: Secondary | ICD-10-CM

## 2023-12-24 DIAGNOSIS — R8271 Bacteriuria: Secondary | ICD-10-CM

## 2023-12-24 DIAGNOSIS — O09523 Supervision of elderly multigravida, third trimester: Secondary | ICD-10-CM

## 2023-12-24 NOTE — Progress Notes (Signed)
   PRENATAL VISIT NOTE  Subjective:  Diamond Baldwin is a 38 y.o. G3P2002 at [redacted]w[redacted]d being seen today for ongoing prenatal care.  She is currently monitored for the following issues for this high-risk pregnancy and has Supervision of other normal pregnancy, antepartum and GBS bacteriuria on their problem list.  Patient reports no complaints.  Contractions: Irregular. Vag. Bleeding: None.  Movement: Present. Denies leaking of fluid.   The following portions of the patient's history were reviewed and updated as appropriate: allergies, current medications, past family history, past medical history, past social history, past surgical history and problem list.   Objective:   Vitals:   12/24/23 0857  BP: 106/72  Pulse: 76  Weight: 135 lb (61.2 kg)   Body mass index is 25.51 kg/m. Total weight gain: 15 lb (6.804 kg)   Fetal Status: Fetal Heart Rate (bpm): 150 Fundal Height: 29 cm Movement: Present     General:  Alert, oriented and cooperative. Patient is in no acute distress.  Skin: Skin is warm and dry. No rash noted.   Cardiovascular: Normal heart rate noted  Respiratory: Normal respiratory effort, no problems with respiration noted  Abdomen: Soft, gravid, appropriate for gestational age.  Pain/Pressure: Present     Pelvic: Cervical exam deferred        Extremities: Normal range of motion.     Mental Status: Normal mood and affect. Normal behavior. Normal judgment and thought content.   Assessment and Plan:  Pregnancy: G3P2002 at [redacted]w[redacted]d 1. Supervision of other normal pregnancy, antepartum (Primary) Anticipatory guidance Fetal kick counts reviewed  2. GBS bacteriuria PCN in labor  3. Language barrier In person interpreter Diamond Baldwin utilized  4. AMA (advanced maternal age) primigravida 35+, third trimester Low risk NIPT  Preterm labor symptoms and general obstetric precautions including but not limited to vaginal bleeding, contractions, leaking of fluid and fetal  movement were reviewed in detail with the patient. Please refer to After Visit Summary for other counseling recommendations.   Return in about 2 weeks (around 01/07/2024).  Future Appointments  Date Time Provider Department Center  12/29/2023  9:00 AM [facility] PROVIDER 1 [facility]  12/29/2023  9:30 AM [facility] US4 [facility]    Rollo ONEIDA Bring, MD

## 2023-12-29 ENCOUNTER — Ambulatory Visit: Payer: Self-pay | Attending: Obstetrics and Gynecology | Admitting: Obstetrics

## 2023-12-29 ENCOUNTER — Other Ambulatory Visit: Payer: Self-pay | Admitting: *Deleted

## 2023-12-29 ENCOUNTER — Ambulatory Visit (HOSPITAL_BASED_OUTPATIENT_CLINIC_OR_DEPARTMENT_OTHER): Payer: Self-pay

## 2023-12-29 VITALS — BP 127/73 | HR 79

## 2023-12-29 DIAGNOSIS — O358XX0 Maternal care for other (suspected) fetal abnormality and damage, not applicable or unspecified: Secondary | ICD-10-CM | POA: Insufficient documentation

## 2023-12-29 DIAGNOSIS — O0933 Supervision of pregnancy with insufficient antenatal care, third trimester: Secondary | ICD-10-CM

## 2023-12-29 DIAGNOSIS — Z363 Encounter for antenatal screening for malformations: Secondary | ICD-10-CM | POA: Insufficient documentation

## 2023-12-29 DIAGNOSIS — Z3A31 31 weeks gestation of pregnancy: Secondary | ICD-10-CM

## 2023-12-29 DIAGNOSIS — O43193 Other malformation of placenta, third trimester: Secondary | ICD-10-CM

## 2023-12-29 DIAGNOSIS — Z3A30 30 weeks gestation of pregnancy: Secondary | ICD-10-CM

## 2023-12-29 DIAGNOSIS — Z3480 Encounter for supervision of other normal pregnancy, unspecified trimester: Secondary | ICD-10-CM

## 2023-12-29 DIAGNOSIS — O09523 Supervision of elderly multigravida, third trimester: Secondary | ICD-10-CM

## 2023-12-29 NOTE — Progress Notes (Signed)
 MFM Consult Note  Diamond Baldwin is currently at 31 weeks and 0 days.  She was seen as she presented late for prenatal care.   The patient denies any significant past medical history and denies any problems in her current pregnancy.  Her blood pressure today was 127/73.  She had a cell free DNA test earlier in her pregnancy which indicated a low risk for trisomy 89, 68, and 13. A female fetus is predicted.   On today's exam, the overall EFW of 3 pounds 6 ounces measures at the 18th percentile for her gestational age.    Today's exam was consistent with an [facility] of March 02, 2024.    There was normal amniotic fluid noted on today's exam with a total AFI of 16.14 cm.  There were no obvious fetal anomalies noted on today's ultrasound exam.  However, today's exam was limited due to her advanced gestational age.  The patient was informed that anomalies may be missed due to technical limitations. If the fetus is in a suboptimal position or maternal habitus is increased, visualization of the fetus in the maternal uterus may be impaired.  A probable posterior succenturiate lobe of the placenta was noted on today's exam.  The main anterior lobe appeared within normal limits.    There were no signs of vasa previa noted today.    The patient was advised that she may attempt a vaginal delivery at term should she desire.    Care should be taken to ensure that both the main and the succenturiate lobe of the placenta are removed at the time of delivery.   As she presented late for prenatal care, a follow-up growth scan was scheduled in 4 weeks.    The patient stated that all of her questions were answered today.    All conversations were held with the patient today with the help of a Spanish interpreter.  A total of 30 minutes was spent counseling and coordinating the care for this patient.  Greater than 50% of the time was spent in direct face-to-face contact.

## 2024-01-07 ENCOUNTER — Ambulatory Visit: Payer: Self-pay | Admitting: Obstetrics & Gynecology

## 2024-01-07 VITALS — BP 120/79 | HR 67 | Wt 135.3 lb

## 2024-01-07 DIAGNOSIS — Z3480 Encounter for supervision of other normal pregnancy, unspecified trimester: Secondary | ICD-10-CM

## 2024-01-07 DIAGNOSIS — O09513 Supervision of elderly primigravida, third trimester: Secondary | ICD-10-CM

## 2024-01-07 DIAGNOSIS — Z3A32 32 weeks gestation of pregnancy: Secondary | ICD-10-CM

## 2024-01-07 NOTE — Progress Notes (Signed)
   PRENATAL VISIT NOTE  Subjective:  Diamond Baldwin is a 38 y.o. G3P2002 at [redacted]w[redacted]d being seen today for ongoing prenatal care.  She is currently monitored for the following issues for this high-risk pregnancy and has Supervision of other normal pregnancy, antepartum; GBS bacteriuria; and AMA (advanced maternal age) primigravida 35+, third trimester on their problem list.  Patient reports no complaints.  Contractions: Irritability. Vag. Bleeding: None.  Movement: Present. Denies leaking of fluid.   The following portions of the patient's history were reviewed and updated as appropriate: allergies, current medications, past family history, past medical history, past social history, past surgical history and problem list.   Objective:    Vitals:   01/07/24 0858  BP: 120/79  Pulse: 67  Weight: 135 lb 4.8 oz (61.4 kg)    Fetal Status:  Fetal Heart Rate (bpm): 136   Movement: Present    General: Alert, oriented and cooperative. Patient is in no acute distress.  Skin: Skin is warm and dry. No rash noted.   Cardiovascular: Normal heart rate noted  Respiratory: Normal respiratory effort, no problems with respiration noted  Abdomen: Soft, gravid, appropriate for gestational age.  Pain/Pressure: Absent     Pelvic: Cervical exam deferred        Extremities: Normal range of motion.  Edema: None  Mental Status: Normal mood and affect. Normal behavior. Normal judgment and thought content.   Assessment and Plan:  Pregnancy: G3P2002 at [redacted]w[redacted]d 1. Supervision of other normal pregnancy, antepartum (Primary)   2. [redacted] weeks gestation of pregnancy   3. AMA (advanced maternal age) primigravida 38+, third trimester   Preterm labor symptoms and general obstetric precautions including but not limited to vaginal bleeding, contractions, leaking of fluid and fetal movement were reviewed in detail with the patient. Please refer to After Visit Summary for other counseling recommendations.    Return in about 2 weeks (around 01/21/2024).  Future Appointments  Date Time Provider Department Center  01/23/2024 10:35 AM Delores Nidia CROME, FNP [facility] None  01/28/2024  9:15 AM [facility] PROVIDER 1 [facility]  01/28/2024  9:30 AM [facility] US3 [facility]    Lynwood Solomons, MD

## 2024-01-07 NOTE — Progress Notes (Signed)
 Pt presents for ROB. No questions or concerns. Will defer Tdap vaccine.

## 2024-01-08 ENCOUNTER — Encounter: Payer: Self-pay | Admitting: Physician Assistant

## 2024-01-23 ENCOUNTER — Encounter: Payer: Self-pay | Admitting: Obstetrics and Gynecology

## 2024-01-28 ENCOUNTER — Ambulatory Visit: Payer: Self-pay

## 2024-01-28 ENCOUNTER — Ambulatory Visit: Payer: Self-pay | Attending: Obstetrics and Gynecology | Admitting: Obstetrics and Gynecology

## 2024-01-28 VITALS — BP 118/76 | HR 79

## 2024-01-28 DIAGNOSIS — O0933 Supervision of pregnancy with insufficient antenatal care, third trimester: Secondary | ICD-10-CM | POA: Insufficient documentation

## 2024-01-28 DIAGNOSIS — O09523 Supervision of elderly multigravida, third trimester: Secondary | ICD-10-CM

## 2024-01-28 DIAGNOSIS — Z3A35 35 weeks gestation of pregnancy: Secondary | ICD-10-CM | POA: Insufficient documentation

## 2024-01-28 DIAGNOSIS — O43103 Malformation of placenta, unspecified, third trimester: Secondary | ICD-10-CM

## 2024-01-28 DIAGNOSIS — Z3480 Encounter for supervision of other normal pregnancy, unspecified trimester: Secondary | ICD-10-CM

## 2024-01-28 DIAGNOSIS — Z362 Encounter for other antenatal screening follow-up: Secondary | ICD-10-CM | POA: Insufficient documentation

## 2024-01-28 DIAGNOSIS — O09513 Supervision of elderly primigravida, third trimester: Secondary | ICD-10-CM

## 2024-01-28 DIAGNOSIS — O43193 Other malformation of placenta, third trimester: Secondary | ICD-10-CM | POA: Insufficient documentation

## 2024-01-28 NOTE — Progress Notes (Signed)
 Maternal-Fetal Medicine Consultation Name: Diamond Baldwin MRN: [id number]  G3 P2002 at 35w 1d gestation.  Patient is here for fetal growth assessment. At previous ultrasound, posterior succenturiate lobe and marginal cord insertion were seen.  Patient does not have gestational diabetes.  Blood pressure today at our office is 118/76 mmHg.  Advanced maternal age.  On cell free fetal DNA screening, the risks of fetal aneuploidies are not increased.  Ultrasound Fetal growth is appropriate for gestational age.  Amniotic fluid is normal with good fetal activity seen.  Posterior succenturiate lobe is seen (connected at the fundus).  Placental cord insertion appears normal with no evidence of marginal cord insertion.  Succenturiate lobe I explained the findings with diagrams and that ultrasound has limitations in accurately diagnosing succenturiate lobe.  Placenta should be checked carefully after delivery to rule out retention of succenturiate lobe. I reassured the patient of normal fetal growth assessment.  I explained marginal cord insertion and reassured her that placental cord insertion appears normal on today's ultrasound.   Recommendations - No follow-up appointments were made. - Placenta out to be carefully evaluated after delivery to rule out succenturiate lobe.   Consultation including face-to-face (more than 50%) counseling 20 minutes.

## 2024-02-09 ENCOUNTER — Ambulatory Visit: Payer: Self-pay | Admitting: Obstetrics and Gynecology

## 2024-02-09 ENCOUNTER — Encounter: Payer: Self-pay | Admitting: Obstetrics and Gynecology

## 2024-02-09 VITALS — BP 121/83 | HR 80 | Wt 138.4 lb

## 2024-02-09 DIAGNOSIS — Z3A37 37 weeks gestation of pregnancy: Secondary | ICD-10-CM

## 2024-02-09 DIAGNOSIS — Z603 Acculturation difficulty: Secondary | ICD-10-CM

## 2024-02-09 DIAGNOSIS — O09513 Supervision of elderly primigravida, third trimester: Secondary | ICD-10-CM

## 2024-02-09 DIAGNOSIS — R8271 Bacteriuria: Secondary | ICD-10-CM

## 2024-02-09 DIAGNOSIS — Z758 Other problems related to medical facilities and other health care: Secondary | ICD-10-CM

## 2024-02-09 DIAGNOSIS — Z3480 Encounter for supervision of other normal pregnancy, unspecified trimester: Secondary | ICD-10-CM

## 2024-02-09 NOTE — Progress Notes (Signed)
   PRENATAL VISIT NOTE  Subjective:  Diamond Baldwin is a 38 y.o. G3P2002 at [redacted]w[redacted]d being seen today for ongoing prenatal care.  She is currently monitored for the following issues for this low-risk pregnancy and has Supervision of other normal pregnancy, antepartum; GBS bacteriuria; AMA (advanced maternal age) primigravida 35+, third trimester; and Placenta succenturiate lobe on their problem list.  Patient reports intermittent contractions.  Contractions: Irritability. Vag. Bleeding: None.  Movement: Increased. Denies leaking of fluid.   The following portions of the patient's history were reviewed and updated as appropriate: allergies, current medications, past family history, past medical history, past social history, past surgical history and problem list.   Objective:    Vitals:   02/09/24 1122  BP: 121/83  Pulse: 80  Weight: 138 lb 6.4 oz (62.8 kg)    Fetal Status:  Fetal Heart Rate (bpm): 137   Movement: Increased    General: Alert, oriented and cooperative. Patient is in no acute distress.  Skin: Skin is warm and dry. No rash noted.   Cardiovascular: Normal heart rate noted  Respiratory: Normal respiratory effort, no problems with respiration noted  Abdomen: Soft, gravid, appropriate for gestational age.  Pain/Pressure: Present     Pelvic: Cervical exam deferred        Extremities: Normal range of motion.  Edema: None  Mental Status: Normal mood and affect. Normal behavior. Normal judgment and thought content.   Assessment and Plan:  Pregnancy: G3P2002 at [redacted]w[redacted]d 1. Supervision of other normal pregnancy, antepartum (Primary) BP and FHR normal Doing well, feeling regular movement   2. AMA (advanced maternal age) primigravida 19+, third trimester 8/27 u/s normal growth, normal AFI  3. GBS bacteriuria Tx  in labor   4. Language barrier   5. Placenta succenturiate lobe  6. [redacted] weeks gestation of pregnancy Labor precautions     Term labor symptoms and  general obstetric precautions including but not limited to vaginal bleeding, contractions, leaking of fluid and fetal movement were reviewed in detail with the patient. Please refer to After Visit Summary for other counseling recommendations.    Nidia Daring, FNP

## 2024-02-09 NOTE — Patient Instructions (Signed)
 If you have a patient and his partner is interested in a vasectomy please do the following things.  1.  We are only doing vasectomies for patients who are uninsured or Medicaid.  Please ensure that this is the case prior to sending information.  2.  Collect the information for the patient's partner.  We need name, date of birth so that we can ensure that he has a chart in epic to schedule the consultation visit.  If the patient has been seen at, you can also just send the MRN for the patient's chart.  3.  Send of the patient's partner information to Victor Cresenzo via staff message so that patient can be added to the list.  4.  Please let the patient know that he will need to be scheduled for a consult visit prior to the procedure being done.  If the patient has Medicaid that consult visit has to be at least 30 days prior to the procedure.  5.  The cost of the vasectomy out-of-pocket for uninsured patients is estimated $98.80 for consult and $653.60 for the vasectomy. This is including the [facility] discount.

## 2024-02-09 NOTE — Progress Notes (Signed)
 Pt states she had an US  done last week and was told she has an extra placenta present and to let our office know.   No other concerns today.

## 2024-02-10 ENCOUNTER — Other Ambulatory Visit (HOSPITAL_COMMUNITY)
Admission: RE | Admit: 2024-02-10 | Discharge: 2024-02-10 | Disposition: A | Discharge status: Home / Self Care | Payer: Self-pay | Source: Ambulatory Visit | Attending: Obstetrics and Gynecology | Admitting: Obstetrics and Gynecology

## 2024-02-10 DIAGNOSIS — Z3480 Encounter for supervision of other normal pregnancy, unspecified trimester: Secondary | ICD-10-CM | POA: Insufficient documentation

## 2024-02-10 DIAGNOSIS — Z3A37 37 weeks gestation of pregnancy: Secondary | ICD-10-CM | POA: Insufficient documentation

## 2024-02-10 NOTE — Addendum Note (Signed)
 Addended by: ALVIA ROSINA GAILS on: 02/10/2024 01:52 PM   Modules accepted: Orders

## 2024-02-11 LAB — CERVICOVAGINAL ANCILLARY ONLY
Chlamydia: NEGATIVE
Comment: NEGATIVE
Comment: NEGATIVE
Comment: NORMAL
Neisseria Gonorrhea: NEGATIVE
Trichomonas: NEGATIVE

## 2024-02-18 ENCOUNTER — Other Ambulatory Visit (HOSPITAL_COMMUNITY)
Admission: RE | Admit: 2024-02-18 | Discharge: 2024-02-18 | Disposition: A | Discharge status: Home / Self Care | Payer: Self-pay | Source: Ambulatory Visit | Attending: Obstetrics and Gynecology | Admitting: Obstetrics and Gynecology

## 2024-02-18 ENCOUNTER — Encounter: Payer: Self-pay | Admitting: Obstetrics and Gynecology

## 2024-02-18 ENCOUNTER — Ambulatory Visit (INDEPENDENT_AMBULATORY_CARE_PROVIDER_SITE_OTHER): Payer: Self-pay | Admitting: Obstetrics and Gynecology

## 2024-02-18 VITALS — BP 124/84 | HR 90 | Wt 142.0 lb

## 2024-02-18 DIAGNOSIS — Z3480 Encounter for supervision of other normal pregnancy, unspecified trimester: Secondary | ICD-10-CM

## 2024-02-18 DIAGNOSIS — R8271 Bacteriuria: Secondary | ICD-10-CM

## 2024-02-18 DIAGNOSIS — Z758 Other problems related to medical facilities and other health care: Secondary | ICD-10-CM

## 2024-02-18 DIAGNOSIS — N898 Other specified noninflammatory disorders of vagina: Secondary | ICD-10-CM | POA: Insufficient documentation

## 2024-02-18 DIAGNOSIS — Z3A38 38 weeks gestation of pregnancy: Secondary | ICD-10-CM

## 2024-02-18 DIAGNOSIS — O09513 Supervision of elderly primigravida, third trimester: Secondary | ICD-10-CM

## 2024-02-18 DIAGNOSIS — Z603 Acculturation difficulty: Secondary | ICD-10-CM

## 2024-02-18 MED ORDER — FLUCONAZOLE 150 MG PO TABS: 150.0000 mg | ORAL_TABLET | Freq: Once | ORAL | 1 refills | Status: AC

## 2024-02-18 NOTE — Progress Notes (Signed)
   PRENATAL VISIT NOTE  Subjective:  Diamond Baldwin is a 38 y.o. G3P2002 at [redacted]w[redacted]d being seen today for ongoing prenatal care.  She is currently monitored for the following issues for this low-risk pregnancy and has Supervision of other normal pregnancy, antepartum; GBS bacteriuria; AMA (advanced maternal age) primigravida 35+, third trimester; and Placenta succenturiate lobe on their problem list.  Patient reports clumpy vaginal discharge and vaginal itching.  Contractions: Irritability. Vag. Bleeding: None.  Movement: Increased. Denies leaking of fluid.   The following portions of the patient's history were reviewed and updated as appropriate: allergies, current medications, past family history, past medical history, past social history, past surgical history and problem list.   Objective:    Vitals:   02/18/24 1014  BP: 124/84  Pulse: 90  Weight: 142 lb (64.4 kg)    Fetal Status:  Fetal Heart Rate (bpm): 139   Movement: Increased    General: Alert, oriented and cooperative. Patient is in no acute distress.  Skin: Skin is warm and dry. No rash noted.   Cardiovascular: Normal heart rate noted  Respiratory: Normal respiratory effort, no problems with respiration noted  Abdomen: Soft, gravid, appropriate for gestational age.  Pain/Pressure: Present     Pelvic: Cervical exam performed in the presence of a chaperone        Extremities: Normal range of motion.  Edema: None  Mental Status: Normal mood and affect. Normal behavior. Normal judgment and thought content.   Assessment and Plan:  Pregnancy: G3P2002 at [redacted]w[redacted]d 1. Supervision of other normal pregnancy, antepartum (Primary) BP and FHR normal Doing well, feeling regular movement    2. AMA (advanced maternal age) primigravida 46+, third trimester 8/27 normal growth  3. GBS bacteriuria Tx in labor   4. Language barrier In person interpreter  5. Placenta succenturiate lobe   6. [redacted] weeks gestation of  pregnancy Labor precautions  7. Vaginal itching 8. Vaginal discharge Treat based on symptoms  Rx diflucan   Term labor symptoms and general obstetric precautions including but not limited to vaginal bleeding, contractions, leaking of fluid and fetal movement were reviewed in detail with the patient. Please refer to After Visit Summary for other counseling recommendations.   Return in about 1 week (around 02/25/2024) for OB VISIT (MD or APP).    Nidia Daring, FNP

## 2024-02-18 NOTE — Progress Notes (Signed)
 Currently has vaginal itchiness. Yellow thick discharge.  Asking for cervical check.

## 2024-02-19 LAB — CERVICOVAGINAL ANCILLARY ONLY
Bacterial Vaginitis (gardnerella): NEGATIVE
Candida Glabrata: NEGATIVE
Candida Vaginitis: NEGATIVE
Comment: NEGATIVE
Comment: NEGATIVE
Comment: NEGATIVE

## 2024-02-27 ENCOUNTER — Ambulatory Visit: Payer: Self-pay | Admitting: Physician Assistant

## 2024-02-27 VITALS — BP 126/91 | HR 90 | Wt 143.9 lb

## 2024-02-27 DIAGNOSIS — Z3A39 39 weeks gestation of pregnancy: Secondary | ICD-10-CM

## 2024-02-27 DIAGNOSIS — Z3480 Encounter for supervision of other normal pregnancy, unspecified trimester: Secondary | ICD-10-CM

## 2024-02-27 DIAGNOSIS — R8271 Bacteriuria: Secondary | ICD-10-CM

## 2024-02-27 DIAGNOSIS — O09513 Supervision of elderly primigravida, third trimester: Secondary | ICD-10-CM

## 2024-02-27 LAB — OB RESULTS CONSOLE GBS: GBS: POSITIVE

## 2024-02-27 NOTE — Progress Notes (Signed)
   PRENATAL VISIT NOTE  Subjective:  Diamond Baldwin is a 38 y.o. G3P2002 at [redacted]w[redacted]d being seen today for ongoing prenatal care.  She is currently monitored for the following issues for this low-risk pregnancy and has Supervision of other normal pregnancy, antepartum; GBS bacteriuria; AMA (advanced maternal age) primigravida 35+, third trimester; and Placenta succenturiate lobe on their problem list.  Patient reports no complaints.  Contractions: Irritability. Vag. Bleeding: None.  Movement: Present. Denies leaking of fluid.   The following portions of the patient's history were reviewed and updated as appropriate: allergies, current medications, past family history, past medical history, past social history, past surgical history and problem list.   Objective:    Vitals:   02/27/24 0958 02/27/24 1001  BP: (!) 129/90 (!) 126/91  Pulse: 77 90  Weight: 143 lb 14.4 oz (65.3 kg)     Fetal Status:  Fetal Heart Rate (bpm): 161 Fundal Height: 38 cm Movement: Present    General: Alert, oriented and cooperative. Patient is in no acute distress.  Skin: Skin is warm and dry. No rash noted.   Cardiovascular: Normal heart rate noted  Respiratory: Normal respiratory effort, no problems with respiration noted  Abdomen: Soft, gravid, appropriate for gestational age.  Pain/Pressure: Present     Pelvic: Cervical exam performed in the presence of a chaperone 80/1 cm        Extremities: Normal range of motion.  Edema: None  Mental Status: Normal mood and affect. Normal behavior. Normal judgment and thought content.   Assessment and Plan:  Pregnancy: G3P2002 at [redacted]w[redacted]d  1. Supervision of other normal pregnancy, antepartum (Primary) Patient doing well, feeling regular fetal movement BP, FHR, FH appropriate   2. [redacted] weeks gestation of pregnancy Anticipatory guidance about next visits/weeks of pregnancy given.  10/6 postdates IOL requested  3. AMA (advanced maternal age) primigravida 35+,  third trimester   4. Placenta succenturiate lobe   5. GBS bacteriuria Intrapartum treatment  Term labor symptoms and general obstetric precautions including but not limited to vaginal bleeding, contractions, leaking of fluid and fetal movement were reviewed in detail with the patient.  Please refer to After Visit Summary for other counseling recommendations.   Return in about 1 week (around 03/05/2024) for LOB.  Future Appointments  Date Time Provider Department Center  03/05/2024 10:35 AM Constant, Winton, MD [facility] None    Jorene FORBES Moats, PA-C

## 2024-02-27 NOTE — Progress Notes (Signed)
 Pt presents for rob. Pt has no questions or concerns at this time.

## 2024-03-01 ENCOUNTER — Encounter (HOSPITAL_COMMUNITY): Payer: Self-pay | Admitting: Obstetrics & Gynecology

## 2024-03-01 ENCOUNTER — Inpatient Hospital Stay (HOSPITAL_COMMUNITY)
Admission: AD | Admit: 2024-03-01 | Discharge: 2024-03-03 | DRG: 807 | Disposition: A | Discharge status: Home / Self Care | Payer: Self-pay | Attending: Family Medicine | Admitting: Family Medicine

## 2024-03-01 ENCOUNTER — Other Ambulatory Visit: Payer: Self-pay

## 2024-03-01 DIAGNOSIS — Z3A40 40 weeks gestation of pregnancy: Secondary | ICD-10-CM | POA: Diagnosis not present

## 2024-03-01 DIAGNOSIS — O99820 Streptococcus B carrier state complicating pregnancy: Secondary | ICD-10-CM

## 2024-03-01 DIAGNOSIS — Z23 Encounter for immunization: Secondary | ICD-10-CM

## 2024-03-01 DIAGNOSIS — O134 Gestational [pregnancy-induced] hypertension without significant proteinuria, complicating childbirth: Principal | ICD-10-CM | POA: Diagnosis present

## 2024-03-01 DIAGNOSIS — O4423 Partial placenta previa NOS or without hemorrhage, third trimester: Secondary | ICD-10-CM

## 2024-03-01 DIAGNOSIS — Z3480 Encounter for supervision of other normal pregnancy, unspecified trimester: Principal | ICD-10-CM

## 2024-03-01 DIAGNOSIS — O480 Post-term pregnancy: Secondary | ICD-10-CM

## 2024-03-01 DIAGNOSIS — O09523 Supervision of elderly multigravida, third trimester: Secondary | ICD-10-CM

## 2024-03-01 DIAGNOSIS — R030 Elevated blood-pressure reading, without diagnosis of hypertension: Secondary | ICD-10-CM | POA: Diagnosis present

## 2024-03-01 DIAGNOSIS — O43193 Other malformation of placenta, third trimester: Secondary | ICD-10-CM | POA: Diagnosis present

## 2024-03-01 DIAGNOSIS — O99824 Streptococcus B carrier state complicating childbirth: Secondary | ICD-10-CM | POA: Diagnosis present

## 2024-03-01 DIAGNOSIS — Z3490 Encounter for supervision of normal pregnancy, unspecified, unspecified trimester: Secondary | ICD-10-CM

## 2024-03-01 LAB — COMPREHENSIVE METABOLIC PANEL WITH GFR
ALT: 20 U/L (ref 0–44)
AST: 23 U/L (ref 15–41)
Albumin: 2.8 g/dL — ABNORMAL LOW (ref 3.5–5.0)
Alkaline Phosphatase: 184 U/L — ABNORMAL HIGH (ref 38–126)
Anion gap: 13 (ref 5–15)
BUN: 9 mg/dL (ref 6–20)
CO2: 18 mmol/L — ABNORMAL LOW (ref 22–32)
Calcium: 8.6 mg/dL — ABNORMAL LOW (ref 8.9–10.3)
Chloride: 105 mmol/L (ref 98–111)
Creatinine, Ser: 0.65 mg/dL (ref 0.44–1.00)
GFR, Estimated: >60 mL/min (ref >60)
Glucose, Bld: 88 mg/dL (ref 70–99)
Potassium: 4.1 mmol/L (ref 3.5–5.1)
Sodium: 136 mmol/L (ref 135–145)
Total Bilirubin: 0.5 mg/dL (ref 0.0–1.2)
Total Protein: 6.5 g/dL (ref 6.5–8.1)

## 2024-03-01 LAB — HIV ANTIBODY (ROUTINE TESTING W REFLEX): HIV Screen 4th Generation wRfx: NONREACTIVE

## 2024-03-01 LAB — CBC WITH DIFFERENTIAL/PLATELET
Abs Immature Granulocytes: 0.22 K/uL — ABNORMAL HIGH (ref 0.00–0.07)
Basophils Absolute: 0 K/uL (ref 0.0–0.1)
Basophils Relative: 0 %
Eosinophils Absolute: 0.1 K/uL (ref 0.0–0.5)
Eosinophils Relative: 1 %
HCT: 38.4 % (ref 36.0–46.0)
Hemoglobin: 12.5 g/dL (ref 12.0–15.0)
Immature Granulocytes: 2 %
Lymphocytes Relative: 24 %
Lymphs Abs: 2.7 K/uL (ref 0.7–4.0)
MCH: 29.1 pg (ref 26.0–34.0)
MCHC: 32.6 g/dL (ref 30.0–36.0)
MCV: 89.5 fL (ref 80.0–100.0)
Monocytes Absolute: 0.6 K/uL (ref 0.1–1.0)
Monocytes Relative: 5 %
Neutro Abs: 7.8 K/uL — ABNORMAL HIGH (ref 1.7–7.7)
Neutrophils Relative %: 68 %
Platelets: 255 K/uL (ref 150–400)
RBC: 4.29 MIL/uL (ref 3.87–5.11)
RDW: 13.1 % (ref 11.5–15.5)
WBC: 11.4 K/uL — ABNORMAL HIGH (ref 4.0–10.5)
nRBC: 0 % (ref 0.0–0.2)

## 2024-03-01 LAB — TYPE AND SCREEN
ABO/RH(D): A POS
Antibody Screen: NEGATIVE

## 2024-03-01 LAB — RPR: RPR Ser Ql: NONREACTIVE

## 2024-03-01 MED ORDER — OXYCODONE-ACETAMINOPHEN 5-325 MG PO TABS: 1.0000 | ORAL_TABLET | ORAL | Status: DC | PRN

## 2024-03-01 MED ORDER — ONDANSETRON HCL 4 MG PO TABS: 4.0000 mg | ORAL_TABLET | ORAL | Status: DC | PRN

## 2024-03-01 MED ORDER — SIMETHICONE 80 MG PO CHEW: 80.0000 mg | CHEWABLE_TABLET | ORAL | Status: DC | PRN

## 2024-03-01 MED ORDER — ACETAMINOPHEN 325 MG PO TABS: 650.0000 mg | ORAL_TABLET | ORAL | Status: DC | PRN

## 2024-03-01 MED ORDER — LACTATED RINGERS IV SOLN: 500.0000 mL | INTRAVENOUS | Status: DC | PRN

## 2024-03-01 MED ORDER — POTASSIUM CHLORIDE CRYS ER 20 MEQ PO TBCR
20.0000 meq | EXTENDED_RELEASE_TABLET | Freq: Two times a day (BID) | ORAL | Status: DC
Start: 2024-03-01 — End: 2024-03-03
  Administered 2024-03-01 – 2024-03-03 (×5): 20 meq via ORAL
  Filled 2024-03-01 (×5): qty 1

## 2024-03-01 MED ORDER — ONDANSETRON HCL 4 MG/2ML IJ SOLN: 4.0000 mg | Freq: Four times a day (QID) | INTRAMUSCULAR | Status: DC | PRN

## 2024-03-01 MED ORDER — BENZOCAINE-MENTHOL 20-0.5 % EX AERO: 1.0000 | INHALATION_SPRAY | CUTANEOUS | Status: DC | PRN

## 2024-03-01 MED ORDER — OXYTOCIN-SODIUM CHLORIDE 30-0.9 UT/500ML-% IV SOLN
2.5000 [IU]/h | INTRAVENOUS | Status: DC
  Filled 2024-03-01: qty 500

## 2024-03-01 MED ORDER — PENICILLIN G POT IN DEXTROSE 60000 UNIT/ML IV SOLN: 3.0000 10*6.[IU] | INTRAVENOUS | Status: DC

## 2024-03-01 MED ORDER — PENICILLIN G POTASSIUM 5000000 UNITS IJ SOLR: 2.5000 10*6.[IU] | INTRAVENOUS | Status: DC

## 2024-03-01 MED ORDER — TETANUS-DIPHTH-ACELL PERTUSSIS 5-2.5-18.5 LF-MCG/0.5 IM SUSY: 0.5000 mL | PREFILLED_SYRINGE | Freq: Once | INTRAMUSCULAR | Status: DC

## 2024-03-01 MED ORDER — INFLUENZA VIRUS VACC SPLIT PF (FLUZONE) 0.5 ML IM SUSY
0.5000 mL | PREFILLED_SYRINGE | INTRAMUSCULAR | Status: AC
  Administered 2024-03-02: 0.5 mL via INTRAMUSCULAR
  Filled 2024-03-01: qty 0.5

## 2024-03-01 MED ORDER — LACTATED RINGERS IV SOLN: INTRAVENOUS | Status: DC

## 2024-03-01 MED ORDER — IBUPROFEN 600 MG PO TABS
600.0000 mg | ORAL_TABLET | Freq: Four times a day (QID) | ORAL | Status: DC
  Administered 2024-03-01 – 2024-03-03 (×9): 600 mg via ORAL
  Filled 2024-03-01 (×9): qty 1

## 2024-03-01 MED ORDER — SODIUM CHLORIDE 0.9 % IV SOLN: 5.0000 10*6.[IU] | Freq: Once | INTRAVENOUS | Status: DC

## 2024-03-01 MED ORDER — PRENATAL MULTIVITAMIN CH
1.0000 | ORAL_TABLET | Freq: Every day | ORAL | Status: DC
  Administered 2024-03-01 – 2024-03-03 (×3): 1 via ORAL
  Filled 2024-03-01 (×3): qty 1

## 2024-03-01 MED ORDER — SODIUM CHLORIDE 0.9 % IV SOLN
5.0000 10*6.[IU] | Freq: Once | INTRAVENOUS | Status: AC
  Administered 2024-03-01: 5 10*6.[IU] via INTRAVENOUS
  Filled 2024-03-01: qty 5

## 2024-03-01 MED ORDER — ONDANSETRON HCL 4 MG/2ML IJ SOLN: 4.0000 mg | INTRAMUSCULAR | Status: DC | PRN

## 2024-03-01 MED ORDER — LIDOCAINE HCL (PF) 1 % IJ SOLN: 30.0000 mL | INTRAMUSCULAR | Status: DC | PRN

## 2024-03-01 MED ORDER — COCONUT OIL OIL: 1.0000 | TOPICAL_OIL | Status: DC | PRN

## 2024-03-01 MED ORDER — FENTANYL CITRATE (PF) 100 MCG/2ML IJ SOLN
100.0000 ug | INTRAMUSCULAR | Status: DC | PRN
  Administered 2024-03-01: 100 ug via INTRAVENOUS
  Filled 2024-03-01: qty 2

## 2024-03-01 MED ORDER — OXYCODONE-ACETAMINOPHEN 5-325 MG PO TABS: 2.0000 | ORAL_TABLET | ORAL | Status: DC | PRN

## 2024-03-01 MED ORDER — OXYTOCIN BOLUS FROM INFUSION
333.0000 mL | Freq: Once | INTRAVENOUS | Status: AC
  Administered 2024-03-01: 333 mL via INTRAVENOUS

## 2024-03-01 MED ORDER — WITCH HAZEL-GLYCERIN EX PADS: 1.0000 | MEDICATED_PAD | CUTANEOUS | Status: DC | PRN

## 2024-03-01 MED ORDER — FUROSEMIDE 20 MG PO TABS
20.0000 mg | ORAL_TABLET | Freq: Two times a day (BID) | ORAL | Status: DC
  Administered 2024-03-01 – 2024-03-03 (×4): 20 mg via ORAL
  Filled 2024-03-01 (×4): qty 1

## 2024-03-01 MED ORDER — SENNOSIDES-DOCUSATE SODIUM 8.6-50 MG PO TABS
2.0000 | ORAL_TABLET | Freq: Every day | ORAL | Status: DC
  Administered 2024-03-02 – 2024-03-03 (×2): 2 via ORAL
  Filled 2024-03-01 (×2): qty 2

## 2024-03-01 MED ORDER — DIBUCAINE (PERIANAL) 1 % EX OINT: 1.0000 | TOPICAL_OINTMENT | CUTANEOUS | Status: DC | PRN

## 2024-03-01 MED ORDER — DIPHENHYDRAMINE HCL 25 MG PO CAPS: 25.0000 mg | ORAL_CAPSULE | Freq: Four times a day (QID) | ORAL | Status: DC | PRN

## 2024-03-01 MED ORDER — SOD CITRATE-CITRIC ACID 500-334 MG/5ML PO SOLN: 30.0000 mL | ORAL | Status: DC | PRN

## 2024-03-01 NOTE — Discharge Summary (Signed)
 Postpartum Discharge Summary  Date of Service updated     Patient Name: Diamond Baldwin DOB: 04-22-1986 MRN: [id number]  Date of admission: 03/01/2024 Delivery date:03/01/2024 Delivering provider: REGINO CREDIT A Date of discharge: 03/01/2024  Admitting diagnosis: Pregnancy [Z34.90] Intrauterine pregnancy: [redacted]w[redacted]d     Secondary diagnosis:  Active Problems:   Pregnancy  Additional problems: GBS (+), Succenturiate lobe , Advanced maternal age   Discharge diagnosis: Term Pregnancy Delivered                                              Post partum procedures:{Postpartum procedures:23558} Augmentation: N/A Complications: {OB Labor/Delivery Complications:20784}  Hospital course: Onset of Labor With Vaginal Delivery      38 y.o. yo G3P2002 at [redacted]w[redacted]d was admitted in Latent Labor on 03/01/2024. Labor course was uncomplicated Membrane Rupture Time/Date: 9:10 AM,03/01/2024  Delivery Method:  Operative Delivery:N/A Episiotomy: None Lacerations:  None Patient had a postpartum course complicated by ***.  She is ambulating, tolerating a regular diet, passing flatus, and urinating well. Patient is discharged home in stable condition on 03/01/24.  Newborn Data: Birth date:03/01/2024 Birth time:9:53 AM Gender:Female Living status:Living Apgars:9 ,9  Weight:   Magnesium Sulfate received: {Mag received:30440022} BMZ received: No Rhophylac:N/A Rh (+) MMR:N/A Immune T-DaP:Declined prenatally Flu: No RSV Vaccine received: No Transfusion:{Transfusion received:30440034}  Immunizations received: Immunization History  Administered Date(s) Administered   Influenza,inj,Quad PF,6+ Mos 07/28/2013   Tdap 10/15/2013    Physical exam  Vitals:   03/01/24 0818 03/01/24 0830 03/01/24 0957 03/01/24 1002  BP:   (!) 162/57 (!) 150/79  Pulse:   (!) 101 94  Resp:  18    Temp:      Weight: 65.8 kg     Height: 5' 1 (1.549 m)      General: {Exam; general:21111117} Lochia: {Desc;  appropriate/inappropriate:30686::appropriate} Uterine Fundus: {Desc; firm/soft:30687} Incision: {Exam; incision:21111123} DVT Evaluation: {Exam; dvt:2111122} Labs: Lab Results  Component Value Date   WBC 11.4 (H) 03/01/2024   HGB 12.5 03/01/2024   HCT 38.4 03/01/2024   MCV 89.5 03/01/2024   PLT 255 03/01/2024      Latest Ref Rng & Units 03/01/2024    7:48 AM  CMP  Glucose 70 - 99 mg/dL 88   BUN 6 - 20 mg/dL 9   Creatinine 9.55 - 8.99 mg/dL 9.34   Sodium 864 - 854 mmol/L 136   Potassium 3.5 - 5.1 mmol/L 4.1   Chloride 98 - 111 mmol/L 105   CO2 22 - 32 mmol/L 18   Calcium 8.9 - 10.3 mg/dL 8.6   Total Protein 6.5 - 8.1 g/dL PENDING   Total Bilirubin 0.0 - 1.2 mg/dL 0.5   Alkaline Phos 38 - 126 U/L 184   AST 15 - 41 U/L 23   ALT 0 - 44 U/L 20    Edinburgh Score:     No data to display         No data recorded  After visit meds:  Allergies as of 03/01/2024   No Known Allergies   Med Rec must be completed prior to using this [facility]***        Discharge home in stable condition Infant Feeding: {Baby feeding:23562} Infant Disposition:{CHL IP OB HOME WITH FNUYZM:76418} Discharge instruction: per After Visit Summary and Postpartum booklet. Activity: Advance as tolerated. Pelvic rest for 6 weeks.  Diet: {OB  ipzu:78888878} Future Appointments: Future Appointments  Date Time Provider Department Center  03/05/2024 10:35 AM Constant, Winton, MD [facility] None   Follow up Visit:   Please schedule this patient for a In person postpartum visit in 6 weeks with the following provider: Any provider. Additional Postpartum F/U:N/A  Low risk pregnancy complicated by: No complications Delivery mode:    Anticipated Birth Control:  POPs  Message sent to [facility] 9/29. - CC   03/01/2024 Colter LITTIE Angles, MD

## 2024-03-01 NOTE — Lactation Note (Signed)
 This note was copied from a baby's chart. Lactation Consultation Note  Patient Name: Diamond Baldwin Unijb'd Date: 03/01/2024 Age:38 hours Reason for consult: Initial assessment;Term  In-house Spanish interpreter used # Maria  Per MOB, infant is breastfeeding well and recent feeding was at 1530 pm for 30 minutes, MOB does not have any questions or concerns for LC at this time. MOB has hx of breastfeeding see maternal data below. MOB declined hand pump, Per MOB, she will be receiving a pump from her local [facility] office. MOB will continue to breastfeed infant by cues, on demand,8-12 times within 24 hours, skin to skin. MOB knows to call [facility] services if she has questions, concerns or needs latch assistance. LC discussed the importance of maternal rest, meals and hydration. MOB was taught hand expression by RN- Kari on [place], MOB taught back and expressed colostrum drops. MOB was made aware of O/P services, breastfeeding support groups, community resources, and our phone # for post-discharge questions.   Maternal Data Has patient been taught Hand Expression?: Yes Does the patient have breastfeeding experience prior to this delivery?: Yes How long did the patient breastfeed?: Per MOB, she breastfeed her 1st child for 2 years and 2nd child for one year.  Feeding Mother's Current Feeding Choice: Breast Milk and Formula  LATCH Score                    Lactation Tools Discussed/Used    Interventions Interventions: Breast feeding basics reviewed;Skin to skin;Position options;Hand express;LC Services brochure  Discharge Pump:  (MOB informed LC she will be recieving pump from the [facility] declined hand pump.) WIC Program: Yes  Consult Status Consult Status: Follow-up Date: 03/02/24 Follow-up type: In-patient    Grayce LULLA Batter 03/01/2024, 4:46 PM

## 2024-03-01 NOTE — MAU Note (Signed)
 Diamond Baldwin is a 38 y.o. at [redacted]w[redacted]d here in MAU reporting: ctx started at 2am coming q 20 min. Reports some bloody show feels like something is leaking. Good fetal movement. 1-2 cm   LMP:  Onset of complaint: 2am Pain score: 7-8 Vitals:   03/01/24 0708  BP: (!) 148/97  Pulse: 78  Resp: 18  Temp: 98.3 F (36.8 C)     FHT: 124   Lab orders placed from triage: labor eval

## 2024-03-01 NOTE — H&P (Signed)
 OBSTETRIC ADMISSION HISTORY AND PHYSICAL  Diamond Baldwin is a 38 y.o. female G3P2002 with IUP at [redacted]w[redacted]d by LMP presenting for early SOL in MAU and subsequent finding of elevated blood pressures ruling her in for pregnancy induced hypertension. She reports +FMs, No LOF, no VB, no blurry vision, headaches or peripheral edema, and RUQ pain.  She plans on breast and formula feeding. She request pills for birth control. She received her prenatal care at [facility]   Dating: By LMP --->  Estimated Date of Delivery: 03/01/24  Sono:    @[redacted]w[redacted]d , CWD, normal anatomy, cephalic presentation, anterior placental lie with posterior lobe, 1522g, 18% EFW   Prenatal History/Complications: Femina - Succenturiate lobe of placenta - GBS positive  - marginal insertion of umbilical cord  Past Medical History: Past Medical History:  Diagnosis Date   Hypertension     Past Surgical History: Past Surgical History:  Procedure Laterality Date   NO PAST SURGERIES      Obstetrical History: OB History     Gravida  3   Para  2   Term  2   Preterm  0   AB  0   Living  2      SAB  0   IAB  0   Ectopic  0   Multiple  0   Live Births  2           Social History Social History   Socioeconomic History   Marital status: Single    Spouse name: Not on file   Number of children: Not on file   Years of education: Not on file   Highest education level: Some college, no degree  Occupational History   Not on file  Tobacco Use   Smoking status: Never   Smokeless tobacco: Never  Vaping Use   Vaping status: Never Used  Substance and Sexual Activity   Alcohol use: Not Currently    Comment: occ.   Drug use: No   Sexual activity: Yes  Other Topics Concern   Not on file  Social History Narrative   ** Merged History Encounter **       Social Drivers of Health   Financial Resource Strain: Not on file  Food Insecurity: No Food Insecurity (03/01/2024)   Hunger Vital Sign     Worried About Running Out of Food in the Last Year: Never true    Ran Out of Food in the Last Year: Never true  Transportation Needs: No Transportation Needs (03/01/2024)   PRAPARE - Administrator, Civil Service (Medical): No    Lack of Transportation (Non-Medical): No  Physical Activity: Insufficiently Active (09/11/2017)   Exercise Vital Sign    Days of Exercise per Week: 3 days    Minutes of Exercise per Session: 30 min  Stress: Stress Concern Present (09/11/2017)   [employer] of Occupational Health - Occupational Stress Questionnaire    Feeling of Stress : Rather much  Social Connections: Moderately Integrated (09/11/2017)   Social Connection and Isolation Panel    Frequency of Communication with Friends and Family: More than three times a week    Frequency of Social Gatherings with Friends and Family: Once a week    Attends Religious Services: More than 4 times per year    Active Member of [employer] or Organizations: No    Attends Banker Meetings: Never    Marital Status: Living with partner    Family History: Family History  Problem  Relation Age of Onset   Diabetes Neg Hx    Hypertension Neg Hx     Allergies: No Known Allergies  Medications Prior to Admission  Medication Sig Dispense Refill Last Dose/Taking   prenatal vitamin w/FE, FA (PRENATAL 1 + 1) 27-1 MG TABS tablet Take 1 tablet by mouth daily. 30 each 12 02/29/2024   amoxicillin  (AMOXIL ) 500 MG capsule Take 1 capsule (500 mg total) by mouth 3 (three) times daily. (Patient not taking: Reported on 12/29/2023) 21 capsule 0 Not Taking   Multiple Vitamin (MULTIVITAMIN) tablet Take 1 tablet by mouth daily. (Patient not taking: Reported on 02/27/2024)        Review of Systems   All systems reviewed and negative except as stated in HPI  Blood pressure (!) 157/94, pulse 80, temperature 98.3 F (36.8 C), resp. rate 18, height 5' 1 (1.549 m), weight 65.8 kg, last menstrual period 05/26/2023,  unknown if currently breastfeeding. General appearance: alert, cooperative, and appears stated age Lungs: clear to auscultation bilaterally Heart: regular rate and rhythm Abdomen: soft, non-tender; bowel sounds normal Pelvic: adequate Extremities: Homans sign is negative, no sign of DVT DTR's +2 Presentation: cephalic Fetal monitoringBaseline: 135 bpm, Variability: Good {> 6 bpm), Accelerations: Reactive, and Decelerations: Absent Uterine activityDate/time of onset: 0200 9/296/25, Frequency: Every 2-5 minutes, Duration: 60-100 seconds, and Intensity: strong Dilation: 2 Effacement (%): 70 Station: -3 Exam by:: MM RN   Prenatal labs: ABO, Rh: --/--/PENDING (09/29 9251) Antibody: PENDING (09/29 0748) Rubella: 9.48 (04/29 1423) RPR: Non Reactive (07/09 0904)  HBsAg: Negative (04/29 1423)  HIV: Non Reactive (07/09 0904)  GBS: Positive/-- (09/26 0000)    Lab Results  Component Value Date   GBS Positive 02/27/2024   GTT WNL Genetic screening  LR female Anatomy US  WNL other than Succenturiate lobe of placenta  Immunization History  Administered Date(s) Administered   Influenza,inj,Quad PF,6+ Mos 07/28/2013   Tdap 10/15/2013    Prenatal Transfer Tool  Maternal Diabetes: No Genetic Screening: Normal Maternal Ultrasounds/Referrals: Other: succenturiate lobe Fetal Ultrasounds or other Referrals:  None Maternal Substance Abuse:  No Significant Maternal Medications:  None Significant Maternal Lab Results: Group B Strep positive Number of Prenatal Visits:greater than 3 verified prenatal visits Maternal Vaccinations: declines Other Comments:  None   Results for orders placed or performed during the hospital encounter of 03/01/24 (from the past 24 hours)  CBC with Differential/Platelet   Collection Time: 03/01/24  7:48 AM  Result Value Ref Range   WBC 11.4 (H) 4.0 - 10.5 K/uL   RBC 4.29 3.87 - 5.11 MIL/uL   Hemoglobin 12.5 12.0 - 15.0 g/dL   HCT 61.5 63.9 - 53.9 %   MCV  89.5 80.0 - 100.0 fL   MCH 29.1 26.0 - 34.0 pg   MCHC 32.6 30.0 - 36.0 g/dL   RDW 86.8 88.4 - 84.4 %   Platelets 255 150 - 400 K/uL   nRBC 0.0 0.0 - 0.2 %   Neutrophils Relative % 68 %   Neutro Abs 7.8 (H) 1.7 - 7.7 K/uL   Lymphocytes Relative 24 %   Lymphs Abs 2.7 0.7 - 4.0 K/uL   Monocytes Relative 5 %   Monocytes Absolute 0.6 0.1 - 1.0 K/uL   Eosinophils Relative 1 %   Eosinophils Absolute 0.1 0.0 - 0.5 K/uL   Basophils Relative 0 %   Basophils Absolute 0.0 0.0 - 0.1 K/uL   Immature Granulocytes 2 %   Abs Immature Granulocytes 0.22 (H) 0.00 - 0.07 K/uL  Type and screen [facility]   Collection Time: 03/01/24  7:48 AM  Result Value Ref Range   ABO/RH(D) PENDING    Antibody Screen PENDING    Sample Expiration      03/04/2024,2359 Performed at [facility], [address] [place], [place] [address]     Patient Active Problem List   Diagnosis Date Noted   Pregnancy 03/01/2024   Placenta succenturiate lobe 02/09/2024   AMA (advanced maternal age) primigravida 35+, third trimester 01/07/2024   GBS bacteriuria 10/10/2023   Supervision of other normal pregnancy, antepartum 09/29/2023    Assessment/Plan:  Raymonde Hamblin is a 38 y.o. G3P2002 at [redacted]w[redacted]d here for SOL in early labor with subsequent finding of elevated blood pressures ruling in for PIH.   #Labor:SOL now in active labor with good progress from admission. Continue expectant management for now.  #Pain: Working through contractions #FWB: Cat 1 #GBS status:  positive #Feeding: Breastmilk  and Formula #Reproductive Life planning: Progesterone only pills and Vasectomy #Circ:  not applicable  Camie DELENA Rote, CNM  03/01/2024, 8:22 AM

## 2024-03-01 NOTE — Lactation Note (Addendum)
 This note was copied from a baby's chart. Lactation Consultation Note  Patient Name: Girl Kimberla Driskill Unijb'd Date: 03/01/2024 Age:38 hours Reason for consult: Initial assessment;Term  P3. W/Spanish interpreter asked mom if BF was going well or did she need any assistance mom stated no BF fine. She was good for now. Maternal Data Has patient been taught Hand Expression?: Yes Does the patient have breastfeeding experience prior to this delivery?: Yes How long did the patient breastfeed?: Per MOB, she breastfeed her 1st child for 2 years and 2nd child for one year.  Feeding Mother's Current Feeding Choice: Breast Milk and Formula  LATCH Score                    Lactation Tools Discussed/Used    Interventions Interventions: Breast feeding basics reviewed;Skin to skin;Position options;Hand express;LC Services brochure  Discharge Pump:  (MOB informed LC she will be recieving pump from the [facility] declined hand pump.) WIC Program: Yes  Consult Status Consult Status: Follow-up Date: 03/02/24 Follow-up type: In-patient    Chemeka Filice G 03/01/2024, 8:05 PM

## 2024-03-02 LAB — CBC
HCT: 33.8 % — ABNORMAL LOW (ref 36.0–46.0)
Hemoglobin: 11.3 g/dL — ABNORMAL LOW (ref 12.0–15.0)
MCH: 29.7 pg (ref 26.0–34.0)
MCHC: 33.4 g/dL (ref 30.0–36.0)
MCV: 88.9 fL (ref 80.0–100.0)
Platelets: 208 K/uL (ref 150–400)
RBC: 3.8 MIL/uL — ABNORMAL LOW (ref 3.87–5.11)
RDW: 13.3 % (ref 11.5–15.5)
WBC: 15.3 K/uL — ABNORMAL HIGH (ref 4.0–10.5)
nRBC: 0 % (ref 0.0–0.2)

## 2024-03-02 MED ORDER — NIFEDIPINE ER OSMOTIC RELEASE 30 MG PO TB24
30.0000 mg | ORAL_TABLET | Freq: Every day | ORAL | Status: DC
  Administered 2024-03-02 – 2024-03-03 (×2): 30 mg via ORAL
  Filled 2024-03-02 (×2): qty 1

## 2024-03-02 NOTE — Patient Instructions (Signed)
 If interested in an outpatient lactation consult in office or virtually please reach out to us  at [facility] (First Floor) [address] [place] Please feel free to out with any lactation related questions or concerns [phone]  to leave a message for our lactation voicemail box.  Lactation support groups:  [facility], Tuesdays 10:00 am -12:00 pm at [address] on the second floor in the conference room, lactating parents and lap babies welcome.  [email or web address]  [email or web address]  Si est interesado en una consulta ambulatoria sobre lactancia en el consultorio o virtualmente, comunquese con nosotros al [facility] scientist) [address] [place], [place] No dude en comunicarse con cualquier pregunta o inquietud relacionada con la lactancia al [phone] para dejar un mensaje en nuestro buzn de voz de lactancia  Grupos de apoyo para la lactancia:  [facility], martes de 10:00 a. m. a 12:00 p. m., en [address], segundo piso, sala de conferencias. Se admiten madres lactantes y bebs en regazo.      Geraldina Louder, [facility] Center for [facility]

## 2024-03-02 NOTE — Progress Notes (Addendum)
 Post Partum Day 1  Subjective: no complaints, up ad lib, voiding, tolerating PO, and + flatus. Diamond Baldwin reports doing well this AM, bonding with baby. Denies nausea / vomiting / fever / dizziness.   Objective: Blood pressure 118/71, pulse 75, temperature 98.1 F (36.7 C), temperature source Oral, resp. rate 17, height 5' 1 (1.549 m), weight 65.8 kg, last menstrual period 05/26/2023, SpO2 100%, unknown if currently breastfeeding.  Physical Exam:  General: alert, cooperative, and no distress Lochia: appropriate Uterine Fundus: firm Incision: N/A DVT Evaluation: No evidence of DVT seen on physical exam. Negative Homan's sign. No cords or calf tenderness. No significant calf/ankle edema.  Recent Labs    03/01/24 0748 03/02/24 0449  HGB 12.5 11.3*  HCT 38.4 33.8*    Assessment/Plan: Diamond Baldwin is 38 y.o. G3P3003 s/p NVD at [redacted]w[redacted]d.   PPD#1 - meeting milestones - continue routine postpartum care  gHTN Severe range 162/57 x1 before delivery.  - mild range since delivery - Lasix + K x5d - start nifed 30mg    Feeding: combo Contraception: vasectomy, will set up consult with [facility]; interval POP   LOS: 1 day  Dispo: PPD2  Terrace Compton, MD 03/02/2024, 7:52 AM   Attestation of Attending Supervision of Resident: Evaluation and management procedures were performed by the learners: Family Medicine Resident under my supervision. I was immediately available for direct supervision, assistance and direction throughout this encounter.  I also confirm that I have verified the information documented in the resident's note, and that I have also personally reperformed the pertinent components of the physical exam and all of the medical decision making activities.  I have also made any necessary editorial changes.  Barabara Maier, DO FMOB Fellow, Faculty Practice [facility], Center for [employer]

## 2024-03-02 NOTE — Progress Notes (Signed)
Ordered pt meals, check on her needs, by Latausha Flamm Spanish Medical Interpreter. °

## 2024-03-03 ENCOUNTER — Other Ambulatory Visit (HOSPITAL_COMMUNITY): Payer: Self-pay

## 2024-03-03 LAB — SURGICAL PATHOLOGY

## 2024-03-03 MED ORDER — FUROSEMIDE 20 MG PO TABS
20.0000 mg | ORAL_TABLET | Freq: Every day | ORAL | 0 refills | Status: AC
  Filled 2024-03-03: qty 3, 3d supply, fill #0

## 2024-03-03 MED ORDER — IBUPROFEN 600 MG PO TABS
600.0000 mg | ORAL_TABLET | Freq: Four times a day (QID) | ORAL | 0 refills | Status: AC
  Filled 2024-03-03: qty 30, 8d supply, fill #0

## 2024-03-03 MED ORDER — POTASSIUM CHLORIDE CRYS ER 20 MEQ PO TBCR
20.0000 meq | EXTENDED_RELEASE_TABLET | Freq: Every day | ORAL | 0 refills | Status: AC
  Filled 2024-03-03: qty 3, 3d supply, fill #0

## 2024-03-03 MED ORDER — ACETAMINOPHEN 325 MG PO TABS
650.0000 mg | ORAL_TABLET | ORAL | 1 refills | Status: AC | PRN
  Filled 2024-03-03: qty 30, 3d supply, fill #0

## 2024-03-03 NOTE — Lactation Note (Signed)
 This note was copied from a baby's chart. Lactation Consultation Note  Patient Name: Diamond Baldwin Unijb'd Date: 03/03/2024 Age:38 hours Reason for consult: Follow-up assessment;Maternal discharge;Term  P3, 40 wks, @ 52 hrs of life. Video interpreter- # [phone] [name] used. Discharge anticipated today. Re-enforced Day 2 baby more awake/ feeding cues/longer feeds, and cluster feeding overnights brings milk in. Highlighted breast stimulation is tied directly to milk production. Hand pump accepted by mom with variety of flanges- highlighted changing sizes with breast changes- she's not sure when she'll get to [facility].  Encouraged mom to keep working on big mouth latch with baby and use EBM or coconut oil after each feed. Discussed cluster feeding overnight/ early morning brings in our milk supply, shared expectations of milk coming in. Highlighted risk of engorgement. Discussed hand pump/express to soften breasts, motrin  as anti-inflammatory, and ice packs for 10-20 minutes post feed/pumping if still over-full is the best treatments for inflamed/engorged breasts.   Maternal Data Does the patient have breastfeeding experience prior to this delivery?: Yes How long did the patient breastfeed?: Per MOB, she breastfeed her 1st child for 2 years and 2nd child for one year.  Feeding Mother's Current Feeding Choice: Breast Milk and Formula  Lactation Tools Discussed/Used Tools: Pump;Flanges Breast pump type: Manual Pump Education: Milk Storage;Setup, frequency, and cleaning  Interventions Interventions: Breast feeding basics reviewed;Hand express;Breast compression;Expressed milk;Hand pump;Education;LC Services brochure;CDC milk storage guidelines  Discharge Discharge Education: Engorgement and breast care Pump: Manual;Personal WIC Program: Yes  Consult Status Consult Status: Complete Date: 03/03/24 Follow-up type: In-patient    Diamond Baldwin 03/03/2024, 2:50 PM

## 2024-03-05 ENCOUNTER — Encounter: Payer: Self-pay | Admitting: Obstetrics and Gynecology

## 2024-03-08 ENCOUNTER — Inpatient Hospital Stay (HOSPITAL_COMMUNITY): Payer: Self-pay

## 2024-03-08 ENCOUNTER — Inpatient Hospital Stay (HOSPITAL_COMMUNITY): Admission: RE | Admit: 2024-03-08 | Payer: Self-pay | Source: Home / Self Care | Admitting: Family Medicine

## 2024-03-10 ENCOUNTER — Ambulatory Visit (INDEPENDENT_AMBULATORY_CARE_PROVIDER_SITE_OTHER): Payer: Self-pay

## 2024-03-10 DIAGNOSIS — Z0130 Encounter for examination of blood pressure without abnormal findings: Secondary | ICD-10-CM

## 2024-03-10 NOTE — Progress Notes (Signed)
 Subjective:  Diamond Baldwin is a [id number] here for BP check.  She is 2weeks postpartum following a normal spontaneous vaginal delivery.    Hypertension ROS: Patient denies headache and visual changes   Objective:  BP 130/88   Pulse 89   LMP 05/26/2023   Breastfeeding Yes   Appearance alert, well appearing, and in no distress.  Assessment:   Blood Pressure well controlled.   Plan:  Current treatment plan is effective, no change in therapy..   Alexiz Cothran N Ashan Cueva, RN

## 2024-04-05 ENCOUNTER — Ambulatory Visit: Payer: Self-pay | Admitting: Obstetrics and Gynecology

## 2024-04-05 ENCOUNTER — Encounter: Payer: Self-pay | Admitting: Obstetrics and Gynecology

## 2024-04-05 VITALS — BP 134/91 | HR 67 | Wt 127.9 lb

## 2024-04-05 DIAGNOSIS — Z3009 Encounter for other general counseling and advice on contraception: Secondary | ICD-10-CM

## 2024-04-05 DIAGNOSIS — Z3202 Encounter for pregnancy test, result negative: Secondary | ICD-10-CM

## 2024-04-05 LAB — POCT URINE PREGNANCY: Preg Test, Ur: NEGATIVE

## 2024-04-05 MED ORDER — NORETHINDRONE 0.35 MG PO TABS: 1.0000 | ORAL_TABLET | Freq: Every day | ORAL | 11 refills | Status: AC

## 2024-04-05 NOTE — Progress Notes (Signed)
 Post Partum Visit Note  Diamond Baldwin is a 38 y.o. [id number] female who presents for a postpartum visit. She is 5 weeks postpartum following a normal spontaneous vaginal delivery.  I have fully reviewed the prenatal and intrapartum course. The delivery was at 40 gestational weeks.  Anesthesia: none. Postpartum course has been baby wakes a lot at night. Baby is doing well yes. Baby is feeding by both breast and bottle - Similac with Iron. Bleeding no bleeding. Bowel function is normal. Bladder function is normal. Patient is not sexually active. Contraception method is abstinence. Postpartum depression screening: negative.   The pregnancy intention screening data noted above was reviewed. Potential methods of contraception were discussed. The patient elected to proceed with No data recorded.   Edinburgh Postnatal Depression Scale - 04/05/24 0940       Edinburgh Postnatal Depression Scale:  In the Past 7 Days   I have been able to laugh and see the funny side of things. 0    I have looked forward with enjoyment to things. 0    I have blamed myself unnecessarily when things went wrong. 0    I have been anxious or worried for no good reason. 0    I have felt scared or panicky for no good reason. 0    Things have been getting on top of me. 0    I have been so unhappy that I have had difficulty sleeping. 0    I have felt sad or miserable. 0    I have been so unhappy that I have been crying. 0    The thought of harming myself has occurred to me. 0    Edinburgh Postnatal Depression Scale Total 0          Health Maintenance Due  Topic Date Due   Hepatitis B Vaccines 19-59 Average Risk (1 of 3 - 19+ 3-dose series) Never done   HPV VACCINES (1 - 3-dose SCDM series) Never done   DTaP/Tdap/Td (2 - Td or Tdap) 10/16/2023   COVID-19 Vaccine (1 - 2025-26 season) Never done       Review of Systems Pertinent items noted in HPI and remainder of comprehensive ROS otherwise  negative.  Objective:  BP (!) 134/91   Pulse 67   Wt 127 lb 14.4 oz (58 kg)   LMP 05/26/2023   Breastfeeding Yes   BMI 24.17 kg/m    General:  alert, cooperative, and no distress   Breasts:  normal  Lungs: clear to auscultation bilaterally  Heart:  regular rate and rhythm  Abdomen: soft, non-tender; bowel sounds normal; no masses,  no organomegaly   Wound N/A  GU exam:  not indicated       Assessment:    There are no diagnoses linked to this encounter.  Normal postpartum exam.   Plan:   Essential components of care per ACOG recommendations:  1.  Mood and well being: Patient with negative depression screening today. Reviewed local resources for support.  - Patient tobacco use? No.   - hx of drug use? No.    2. Infant care and feeding:  -Patient currently breastmilk feeding? Yes. Reviewed importance of draining breast regularly to support lactation.  -Social determinants of health (SDOH) reviewed in EPIC. No concerns  3. Sexuality, contraception and birth spacing - Patient does not want a pregnancy in the next year.  Desired family size is 3 children.  - Reviewed reproductive life planning. Reviewed contraceptive methods based on pt  preferences and effectiveness.  Patient desired Oral Contraceptive today.   - Discussed birth spacing of 18 months  4. Sleep and fatigue -Encouraged family/partner/community support of 4 hrs of uninterrupted sleep to help with mood and fatigue  5. Physical Recovery  - Discussed patients delivery and complications. She describes her labor as good. - Patient had a Vaginal, no problems at delivery. Patient had no laceration. Perineal healing reviewed. Patient expressed understanding - Patient has urinary incontinence? No. - Patient is safe to resume physical and sexual activity  6.  Health Maintenance - HM due items addressed Yes - Last pap smear  Diagnosis  Date Value Ref Range Status  09/30/2023   Final   - Negative for  intraepithelial lesion or malignancy (NILM)   Pap smear not done at today's visit.  -Breast Cancer screening indicated? No.   7. Chronic Disease/Pregnancy Condition follow up: None. Plans to establish care with health department. Information on [facility] provided as well  - PCP follow up  Winton Felt, MD [facility] [employer], [facility]

## 2024-04-05 NOTE — Progress Notes (Signed)
 PP. Interested in birth control pills today.   BP 134/91 and 138/89 today. Pt states that at the hospital she was told to keep an eye on her BP. She reports headaches off and on; that is sometimes helped by tylenol . She reports blurry vision and dizziness at times with the headaches.
